# Patient Record
Sex: Female | Born: 1990 | Race: White | Hispanic: No | Marital: Married | Smoking: Never smoker
Health system: Southern US, Community
[De-identification: ages and names within clinical notes are randomized; demographics above are authoritative.]

## PROBLEM LIST (undated history)

## (undated) DIAGNOSIS — O139 Gestational [pregnancy-induced] hypertension without significant proteinuria, unspecified trimester: Secondary | ICD-10-CM

## (undated) DIAGNOSIS — K439 Ventral hernia without obstruction or gangrene: Secondary | ICD-10-CM

## (undated) DIAGNOSIS — F419 Anxiety disorder, unspecified: Secondary | ICD-10-CM

## (undated) DIAGNOSIS — I10 Essential (primary) hypertension: Secondary | ICD-10-CM

## (undated) DIAGNOSIS — G43109 Migraine with aura, not intractable, without status migrainosus: Secondary | ICD-10-CM

## (undated) HISTORY — DX: Anxiety disorder, unspecified: F41.9

## (undated) HISTORY — DX: Migraine with aura, not intractable, without status migrainosus: G43.109

## (undated) HISTORY — DX: Gestational (pregnancy-induced) hypertension without significant proteinuria, unspecified trimester: O13.9

## (undated) HISTORY — DX: Essential (primary) hypertension: I10

## (undated) HISTORY — PX: EYE SURGERY: SHX253

---
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
## 2011-07-18 LAB — POCT URINALYSIS DIP (DEVICE)
Glucose, UA: NEGATIVE
Hgb urine dipstick: NEGATIVE
Ketones, ur: 15 — AB
Nitrite: NEGATIVE
Operator id: 235561
Protein, ur: 100 — AB
Specific Gravity, Urine: 1.025
Urobilinogen, UA: 1
pH: 5.5

## 2012-03-03 ENCOUNTER — Emergency Department (HOSPITAL_COMMUNITY)
Admission: EM | Admit: 2012-03-03 | Discharge: 2012-03-03 | Disposition: A | Discharge status: Home / Self Care | Payer: BC Managed Care – PPO | Attending: Emergency Medicine | Admitting: Emergency Medicine

## 2012-03-03 ENCOUNTER — Encounter (HOSPITAL_COMMUNITY): Payer: Self-pay

## 2012-03-03 DIAGNOSIS — R51 Headache: Secondary | ICD-10-CM

## 2012-03-03 DIAGNOSIS — R110 Nausea: Secondary | ICD-10-CM | POA: Insufficient documentation

## 2012-03-03 LAB — POCT PREGNANCY, URINE: Preg Test, Ur: NEGATIVE

## 2012-03-03 MED ORDER — SODIUM CHLORIDE 0.9 % IV BOLUS (SEPSIS)
1000.0000 mL | Freq: Once | INTRAVENOUS | Status: AC
  Administered 2012-03-03: 1000 mL via INTRAVENOUS

## 2012-03-03 MED ORDER — SODIUM CHLORIDE 0.9 % IV SOLN
INTRAVENOUS | Status: DC
  Administered 2012-03-03: 20:00:00 via INTRAVENOUS

## 2012-03-03 MED ORDER — KETOROLAC TROMETHAMINE 30 MG/ML IJ SOLN
30.0000 mg | Freq: Once | INTRAMUSCULAR | Status: AC
  Administered 2012-03-03: 30 mg via INTRAVENOUS
  Filled 2012-03-03 (×2): qty 1

## 2012-03-03 MED ORDER — DIPHENHYDRAMINE HCL 50 MG/ML IJ SOLN
25.0000 mg | Freq: Once | INTRAMUSCULAR | Status: AC
  Administered 2012-03-03: 25 mg via INTRAVENOUS
  Filled 2012-03-03: qty 1

## 2012-03-03 MED ORDER — HYDROMORPHONE HCL PF 1 MG/ML IJ SOLN
1.0000 mg | Freq: Once | INTRAMUSCULAR | Status: AC
  Administered 2012-03-03: 1 mg via INTRAVENOUS
  Filled 2012-03-03: qty 1

## 2012-03-03 MED ORDER — DEXAMETHASONE SODIUM PHOSPHATE 10 MG/ML IJ SOLN
10.0000 mg | Freq: Once | INTRAMUSCULAR | Status: AC
  Administered 2012-03-03: 10 mg via INTRAVENOUS
  Filled 2012-03-03: qty 1

## 2012-03-03 MED ORDER — OXYCODONE-ACETAMINOPHEN 5-325 MG PO TABS: 2.0000 | ORAL_TABLET | ORAL | Status: AC | PRN

## 2012-03-03 MED ORDER — METOCLOPRAMIDE HCL 5 MG/ML IJ SOLN
10.0000 mg | Freq: Once | INTRAMUSCULAR | Status: AC
  Administered 2012-03-03: 10 mg via INTRAVENOUS
  Filled 2012-03-03: qty 2

## 2012-03-03 MED ORDER — METOCLOPRAMIDE HCL 10 MG PO TABS: 10.0000 mg | ORAL_TABLET | Freq: Four times a day (QID) | ORAL | Status: DC | PRN

## 2012-03-03 NOTE — ED Notes (Signed)
Patient states she has hx of migraine and states this headache started x 3 days ago and went away and started again last night around midnight and today pain is worse and is located in the back of her head and states this headache is worse than others she has had in the past. Patient state she has not had a menstrual cycle since January. Patient has used ice compresses and Bayer Aspirin for [employer].

## 2012-03-03 NOTE — Discharge Instructions (Signed)

## 2012-03-03 NOTE — ED Provider Notes (Signed)
History   This chart was scribed for Tammy Horn, MD scribed by Magnus Sinning. The patient was seen in room STRE7/STRE7 seen at 19:07    CSN: [id number]  Arrival date & time 03/03/12  7829   First MD Initiated Contact with Patient 03/03/12 1906      Chief Complaint  Patient presents with  . Migraine    (Consider location/radiation/quality/duration/timing/severity/associated sxs/prior treatment) HPI [name] is a 21 y.o. female who presents to the Emergency Department complaining of constant moderate HA, onset 4 days, with it being worse this morning at 1:00 AM, and associated nausea. Reports that she typically gets throbbing all over chronic HA multiple times a week, lasting between hours to days for the past 4 years. Says occasionally she also has associated nausea, vertigo, vomiting, generalized weakness, and numbness, but reports she has not experienced these sxs today. Normally treats HA with Imitrex, Fioricet, and motrin. Explains she did not take any of medications last night or today. Pt also states she was previously on flexeril to treat HA, which she explains helped resolved HA, but is no longer on Flexeril because medicaid ended. Denies changes in speech/vision/understanding, lateralizing coordination, vomiting, confusion, trauma, fever, or rashes.   [facility], but has  sees MD at school. Past Medical History  Diagnosis Date  . Migraine     History reviewed. No pertinent past surgical history.  No family history on file.  History  Substance Use Topics  . Smoking status: Never Smoker   . Smokeless tobacco: Not on file  . Alcohol Use: No   Review of Systems  Constitutional: Negative for fever.       10 Systems reviewed and are negative for acute change except as noted in the HPI.  HENT: Negative for congestion.   Eyes: Negative for discharge and redness.  Respiratory: Negative for cough and shortness of breath.   Cardiovascular: Negative  for chest pain.  Gastrointestinal: Positive for nausea. Negative for vomiting and abdominal pain.  Musculoskeletal: Negative for back pain.  Skin: Negative for rash.  Neurological: Positive for headaches. Negative for syncope and numbness.  Psychiatric/Behavioral:       No behavior change.  All other systems reviewed and are negative.    Allergies  Review of patient's allergies indicates no known allergies.  Home Medications   Current Outpatient Rx  Name Route Sig Dispense Refill  . BUTALBITAL-APAP-CAFFEINE 50-325-40 MG PO TABS Oral Take 1 tablet by mouth every 4 (four) hours as needed. For severe headache    . DICLOFENAC SODIUM 75 MG PO TBEC Oral Take 75 mg by mouth 2 (two) times daily.    . SUMATRIPTAN SUCCINATE 50 MG PO TABS Oral Take 50 mg by mouth daily as needed. For onset migraine    . METOCLOPRAMIDE HCL 10 MG PO TABS Oral Take 1 tablet (10 mg total) by mouth every 6 (six) hours as needed (nausea/headache). 6 tablet 0  . OXYCODONE-ACETAMINOPHEN 5-325 MG PO TABS Oral Take 2 tablets by mouth every 4 (four) hours as needed for pain. 6 tablet 0    BP 129/88  Pulse 74  Temp(Src) 98.2 F (36.8 C) (Oral)  Resp 20  SpO2 98%  LMP 11/04/2011  Physical Exam  Nursing note and vitals reviewed. Constitutional:       Awake, alert, nontoxic appearance with baseline speech for patient.  HENT:  Head: Atraumatic.  Mouth/Throat: Oropharynx is clear and moist. No oropharyngeal exudate.  Eyes: EOM are normal. Pupils  are equal, round, and reactive to light. Right eye exhibits no discharge. Left eye exhibits no discharge.  Neck: Neck supple.  Cardiovascular: Normal rate and regular rhythm.   No murmur heard. Pulmonary/Chest: Effort normal and breath sounds normal. No stridor. No respiratory distress. She has no wheezes. She has no rales. She exhibits no tenderness.  Abdominal: Soft. Bowel sounds are normal. She exhibits no mass. There is no tenderness. There is no rebound.    Musculoskeletal: She exhibits no tenderness.       Baseline ROM, moves extremities with no obvious new focal weakness.  Lymphadenopathy:    She has no cervical adenopathy.  Neurological:       Awake, alert, cooperative and aware of situation; motor strength bilaterally; sensation normal to light touch bilaterally; peripheral visual fields full to confrontation; no facial asymmetry; tongue midline; major cranial nerves appear intact; no pronator drift, normal finger to nose bilaterally, baseline gait without new ataxia.  Skin: No rash noted.  Psychiatric: She has a normal mood and affect.    ED Course  Procedures (including critical care time) DIAGNOSTIC STUDIES: Oxygen Saturation is 98% on room air, normal by my interpretation.    COORDINATION OF CARE: 19:49: EDMD performs recheck. Patient notes headache has improved.   Labs Reviewed  POCT PREGNANCY, URINE   1. Headache       MDM  I personally performed the services described in this documentation, which was scribed in my presence. The recorded information has been reviewed and considered. Pt stable in ED with no significant deterioration in condition.Patient / Family / Caregiver informed of clinical course, understand medical decision-making process, and agree with plan.I doubt any other EMC precluding discharge at this time including, but not necessarily limited to the following:SAH, CVA, SBI.        Tammy Horn, MD 03/03/12 [phone]

## 2012-03-03 NOTE — ED Notes (Signed)
Pt. Reports having a stiff neck, migraine, nausea and photophobia.  She also had the same symptoms on Wednesday.

## 2012-06-23 ENCOUNTER — Encounter (HOSPITAL_COMMUNITY): Payer: Self-pay | Admitting: Adult Health

## 2012-06-23 ENCOUNTER — Emergency Department (HOSPITAL_COMMUNITY)
Admission: EM | Admit: 2012-06-23 | Discharge: 2012-06-23 | Disposition: A | Discharge status: Home / Self Care | Payer: BC Managed Care – PPO | Attending: Emergency Medicine | Admitting: Emergency Medicine

## 2012-06-23 DIAGNOSIS — R51 Headache: Secondary | ICD-10-CM | POA: Insufficient documentation

## 2012-06-23 DIAGNOSIS — G43909 Migraine, unspecified, not intractable, without status migrainosus: Secondary | ICD-10-CM

## 2012-06-23 LAB — PREGNANCY, URINE: Preg Test, Ur: NEGATIVE

## 2012-06-23 MED ORDER — SODIUM CHLORIDE 0.9 % IV BOLUS (SEPSIS)
1000.0000 mL | Freq: Once | INTRAVENOUS | Status: AC
  Administered 2012-06-23: 1000 mL via INTRAVENOUS

## 2012-06-23 MED ORDER — METOCLOPRAMIDE HCL 5 MG/ML IJ SOLN
10.0000 mg | Freq: Once | INTRAMUSCULAR | Status: AC
  Administered 2012-06-23: 10 mg via INTRAVENOUS
  Filled 2012-06-23: qty 2

## 2012-06-23 MED ORDER — DIPHENHYDRAMINE HCL 50 MG/ML IJ SOLN
25.0000 mg | Freq: Once | INTRAMUSCULAR | Status: AC
  Administered 2012-06-23: 25 mg via INTRAVENOUS
  Filled 2012-06-23: qty 1

## 2012-06-23 MED ORDER — DEXAMETHASONE SODIUM PHOSPHATE 10 MG/ML IJ SOLN
10.0000 mg | Freq: Once | INTRAMUSCULAR | Status: AC
  Administered 2012-06-23: 10 mg via INTRAVENOUS
  Filled 2012-06-23: qty 1

## 2012-06-23 NOTE — ED Provider Notes (Signed)
History     CSN: [id number]  Arrival date & time 06/23/12  5284   First MD Initiated Contact with Patient 06/23/12 [phone]      Chief Complaint  Patient presents with  . Migraine    (Consider location/radiation/quality/duration/timing/severity/associated sxs/prior treatment) HPI Comments: Patient presents with a chief complaint of headache.  Headache has been constant since 2pm yesterday.  Headache is located in the left frontal region.  Does not radiate.  Headache associated with two episodes of vomiting yesterday.  She continues to have nausea, but no vomiting today. She states that shellfish typically is a trigger for her migraines.  Yesterday she had lobster ravioli.  She tried taking Bayer Migraine and Fioricet for her migraine, but did not feel that it helped.  She denies head trauma. She states that her headache is similar to Migraine headaches that she has had in the past.     Patient is a 21 y.o. female presenting with migraine. The history is provided by the patient.  Migraine This is a new problem. The current episode started yesterday. The problem occurs constantly. The problem has been gradually improving. Associated symptoms include headaches, nausea and vomiting. Pertinent negatives include no chills, fever, neck pain, numbness, rash, vertigo, visual change or weakness.    Past Medical History  Diagnosis Date  . Migraine     History reviewed. No pertinent past surgical history.  History reviewed. No pertinent family history.  History  Substance Use Topics  . Smoking status: Never Smoker   . Smokeless tobacco: Not on file  . Alcohol Use: No    OB History    Grav Para Term Preterm Abortions TAB SAB Ect Mult Living                  Review of Systems  Constitutional: Negative for fever and chills.  HENT: Negative for neck pain.   Eyes: Positive for photophobia. Negative for pain, redness and visual disturbance.  Gastrointestinal: Positive for nausea and  vomiting.  Skin: Negative for rash.  Neurological: Positive for headaches. Negative for dizziness, vertigo, syncope, weakness, light-headedness and numbness.  Psychiatric/Behavioral: Negative for confusion.    Allergies  Review of patient's allergies indicates no known allergies.  Home Medications   Current Outpatient Rx  Name Route Sig Dispense Refill  . ASPIRIN-ACETAMINOPHEN-CAFFEINE 250-250-65 MG PO TABS Oral Take 1 tablet by mouth every 6 (six) hours as needed. For headache    . BUTALBITAL-APAP-CAFFEINE 50-325-40 MG PO TABS Oral Take 1 tablet by mouth every 4 (four) hours as needed. For severe headache    . DICLOFENAC SODIUM 75 MG PO TBEC Oral Take 75 mg by mouth 2 (two) times daily.      BP 117/75  Pulse 71  Temp 98.7 F (37.1 C) (Oral)  Resp 18  SpO2 98%  Physical Exam  Nursing note and vitals reviewed. Constitutional: She appears well-developed and well-nourished.  HENT:  Head: Normocephalic and atraumatic.  Eyes: EOM are normal. Pupils are equal, round, and reactive to light.  Neck: Normal range of motion. Neck supple.  Cardiovascular: Normal rate, regular rhythm and normal heart sounds.   Pulmonary/Chest: Effort normal and breath sounds normal.  Neurological: She is alert. She has normal strength. No cranial nerve deficit or sensory deficit. Coordination and gait normal.       Normal visual fields Normal finger to nose testing. Normal rapid alternating movements Normal gait, no ataxia.  Skin: Skin is warm and dry.  Psychiatric: She has a normal  mood and affect.    ED Course  Procedures (including critical care time)   Labs Reviewed  PREGNANCY, URINE   No results found.   No diagnosis found.  7:26 AM Reassessed patient.  She states that her headache has resolved at this time.  MDM  Pt HA treated and improved while in ED.  Presentation is like pts typical HA and non concerning for [facility], ICH, or Meningitis. Pt is afebrile with no focal neuro deficits,  nuchal rigidity, or change in vision. No red flags.  Pt is to follow up with PCP to discuss prophylactic medication. Pt verbalizes understanding and is agreeable with plan to dc.         Pascal Lux Brandon, PA-C 06/23/12 [phone]

## 2012-06-23 NOTE — ED Notes (Addendum)
Migraines since 2pm yesterday c/o nausea and neck pain and ear pain. Sensitive to light and sound. Typical migraine for her, rescue medicaitons are not relieving pain. Neurologically intact

## 2012-06-23 NOTE — ED Notes (Signed)
Pt having MIgraine HA the past 5 years.   Last migraine in May 2013.  Tonight HA began at 1400  8.30.13  Not relieved by Bayer Migraine and  Butalb.  C/O left frontal HA behind her left eye, sharp pain into left ear and posterior neck pain.  HA associated with Photophobia, nausea, vomiting times two.Marland Kitchen  Has migraine cooling patch to forehead. Pain rated 8/10

## 2012-06-23 NOTE — ED Provider Notes (Signed)
Medical screening examination/treatment/procedure(s) were performed by non-physician practitioner and as supervising physician I was immediately available for consultation/collaboration.    Esther Broyles D Kelvin Sennett, MD 06/23/12 1704 

## 2012-10-01 ENCOUNTER — Other Ambulatory Visit: Payer: Self-pay | Admitting: Physician Assistant

## 2012-10-02 NOTE — Telephone Encounter (Signed)
No paper chart °

## 2012-10-02 NOTE — Telephone Encounter (Signed)
Please pull chart.

## 2012-10-04 ENCOUNTER — Other Ambulatory Visit: Payer: Self-pay | Admitting: Physician Assistant

## 2012-10-05 NOTE — Telephone Encounter (Signed)
Please pull paper chart.  

## 2012-10-05 NOTE — Telephone Encounter (Signed)
Pt does not exist in [facility] by name and dob. No paper chart.  bf

## 2012-10-09 ENCOUNTER — Other Ambulatory Visit: Payer: Self-pay | Admitting: Physician Assistant

## 2012-10-09 NOTE — Telephone Encounter (Signed)
Please pull paper chart.  

## 2012-10-10 NOTE — Telephone Encounter (Signed)
No paper chart °

## 2012-10-11 ENCOUNTER — Other Ambulatory Visit: Payer: Self-pay | Admitting: Physician Assistant

## 2013-10-02 ENCOUNTER — Emergency Department (HOSPITAL_COMMUNITY)
Admission: EM | Admit: 2013-10-02 | Discharge: 2013-10-02 | Disposition: A | Discharge status: Home / Self Care | Payer: Self-pay | Attending: Emergency Medicine | Admitting: Emergency Medicine

## 2013-10-02 ENCOUNTER — Emergency Department (HOSPITAL_COMMUNITY): Payer: Self-pay

## 2013-10-02 ENCOUNTER — Encounter (HOSPITAL_COMMUNITY): Payer: Self-pay | Admitting: Emergency Medicine

## 2013-10-02 DIAGNOSIS — M549 Dorsalgia, unspecified: Secondary | ICD-10-CM | POA: Insufficient documentation

## 2013-10-02 DIAGNOSIS — Z79899 Other long term (current) drug therapy: Secondary | ICD-10-CM | POA: Insufficient documentation

## 2013-10-02 DIAGNOSIS — R112 Nausea with vomiting, unspecified: Secondary | ICD-10-CM | POA: Insufficient documentation

## 2013-10-02 DIAGNOSIS — Z3202 Encounter for pregnancy test, result negative: Secondary | ICD-10-CM | POA: Insufficient documentation

## 2013-10-02 DIAGNOSIS — R109 Unspecified abdominal pain: Secondary | ICD-10-CM | POA: Insufficient documentation

## 2013-10-02 DIAGNOSIS — Z8669 Personal history of other diseases of the nervous system and sense organs: Secondary | ICD-10-CM | POA: Insufficient documentation

## 2013-10-02 DIAGNOSIS — R509 Fever, unspecified: Secondary | ICD-10-CM | POA: Insufficient documentation

## 2013-10-02 LAB — LIPASE, BLOOD: Lipase: 26 U/L (ref 11–59)

## 2013-10-02 LAB — URINALYSIS, ROUTINE W REFLEX MICROSCOPIC
Glucose, UA: NEGATIVE mg/dL
Hgb urine dipstick: NEGATIVE
Ketones, ur: 40 mg/dL — AB
Nitrite: NEGATIVE
Protein, ur: 30 mg/dL — AB
Specific Gravity, Urine: 1.036 — ABNORMAL HIGH (ref 1.005–1.030)
Urobilinogen, UA: 1 mg/dL (ref 0.0–1.0)
pH: 7 (ref 5.0–8.0)

## 2013-10-02 LAB — COMPREHENSIVE METABOLIC PANEL
ALT: 15 U/L (ref 0–35)
AST: 16 U/L (ref 0–37)
Albumin: 4.1 g/dL (ref 3.5–5.2)
Alkaline Phosphatase: 69 U/L (ref 39–117)
BUN: 12 mg/dL (ref 6–23)
CO2: 24 mEq/L (ref 19–32)
Calcium: 9.4 mg/dL (ref 8.4–10.5)
Chloride: 100 mEq/L (ref 96–112)
Creatinine, Ser: 0.66 mg/dL (ref 0.50–1.10)
GFR calc Af Amer: >90 mL/min (ref >90)
GFR calc non Af Amer: >90 mL/min (ref >90)
Glucose, Bld: 87 mg/dL (ref 70–99)
Potassium: 3.2 mEq/L — ABNORMAL LOW (ref 3.5–5.1)
Sodium: 139 mEq/L (ref 135–145)
Total Bilirubin: 0.3 mg/dL (ref 0.3–1.2)
Total Protein: 8.2 g/dL (ref 6.0–8.3)

## 2013-10-02 LAB — URINE MICROSCOPIC-ADD ON

## 2013-10-02 LAB — CBC WITH DIFFERENTIAL/PLATELET
Basophils Absolute: 0 10*3/uL (ref 0.0–0.1)
Basophils Relative: 0 % (ref 0–1)
Eosinophils Absolute: 0 10*3/uL (ref 0.0–0.7)
Eosinophils Relative: 0 % (ref 0–5)
HCT: 42.8 % (ref 36.0–46.0)
Hemoglobin: 14.4 g/dL (ref 12.0–15.0)
Lymphocytes Relative: 26 % (ref 12–46)
Lymphs Abs: 2.4 10*3/uL (ref 0.7–4.0)
MCH: 29.3 pg (ref 26.0–34.0)
MCHC: 33.6 g/dL (ref 30.0–36.0)
MCV: 87.2 fL (ref 78.0–100.0)
Monocytes Absolute: 0.7 10*3/uL (ref 0.1–1.0)
Monocytes Relative: 7 % (ref 3–12)
Neutro Abs: 6.2 10*3/uL (ref 1.7–7.7)
Neutrophils Relative %: 67 % (ref 43–77)
Platelets: 357 10*3/uL (ref 150–400)
RBC: 4.91 MIL/uL (ref 3.87–5.11)
RDW: 12.9 % (ref 11.5–15.5)
WBC: 9.3 10*3/uL (ref 4.0–10.5)

## 2013-10-02 LAB — POCT PREGNANCY, URINE: Preg Test, Ur: NEGATIVE

## 2013-10-02 MED ORDER — ONDANSETRON HCL 4 MG/2ML IJ SOLN
4.0000 mg | Freq: Once | INTRAMUSCULAR | Status: AC
  Administered 2013-10-02: 4 mg via INTRAVENOUS
  Filled 2013-10-02: qty 2

## 2013-10-02 MED ORDER — HYDROCODONE-ACETAMINOPHEN 5-325 MG PO TABS: 1.0000 | ORAL_TABLET | ORAL | Status: DC | PRN

## 2013-10-02 MED ORDER — SODIUM CHLORIDE 0.9 % IV BOLUS (SEPSIS)
1000.0000 mL | Freq: Once | INTRAVENOUS | Status: AC
  Administered 2013-10-02: 1000 mL via INTRAVENOUS

## 2013-10-02 MED ORDER — ACETAMINOPHEN 325 MG PO TABS
650.0000 mg | ORAL_TABLET | Freq: Once | ORAL | Status: DC
  Filled 2013-10-02: qty 2

## 2013-10-02 MED ORDER — ACETAMINOPHEN 500 MG PO TABS
1000.0000 mg | ORAL_TABLET | Freq: Once | ORAL | Status: AC
  Administered 2013-10-02: 1000 mg via ORAL
  Filled 2013-10-02: qty 2

## 2013-10-02 MED ORDER — ONDANSETRON HCL 4 MG PO TABS: 4.0000 mg | ORAL_TABLET | Freq: Four times a day (QID) | ORAL | Status: DC | PRN

## 2013-10-02 NOTE — ED Provider Notes (Signed)
CSN: [id number]     Arrival date & time 10/02/13  1640 History   First MD Initiated Contact with Patient 10/02/13 1654     Chief Complaint  Patient presents with  . Abdominal Pain  . Emesis   (Consider location/radiation/quality/duration/timing/severity/associated sxs/prior Treatment) HPI  Chief complaint abdominal pain  Patient is a 22 year old female no significant past medical history comes today with chief complaint abdominal pain. Patient states this began yesterday. States she had a right-sided flank pain which is now migrated to flank pain and right sided pain more in upper quadrants. States this is a 9/10. It is waxing and waning. Described as a sharp stabbing pain. Patient also endorses some nausea and vomiting. Denies any vaginal discharge, dysuria.   Past Medical History  Diagnosis Date  . Migraine    History reviewed. No pertinent past surgical history. No family history on file. History  Substance Use Topics  . Smoking status: Never Smoker   . Smokeless tobacco: Not on file  . Alcohol Use: No   OB History   Grav Para Term Preterm Abortions TAB SAB Ect Mult Living                 Review of Systems  Constitutional: Positive for fever (subjective). Negative for fatigue.  Respiratory: Negative for shortness of breath.   Cardiovascular: Negative for chest pain.  Gastrointestinal: Positive for nausea, vomiting and abdominal pain.  Genitourinary: Negative for dysuria and dyspareunia.  Musculoskeletal: Positive for back pain (flank pain).  Skin: Negative for rash.  Neurological: Negative for headaches.  Psychiatric/Behavioral: Negative for agitation.  All other systems reviewed and are negative.    Allergies  Bee venom  Home Medications   Current Outpatient Rx  Name  Route  Sig  Dispense  Refill  . cyclobenzaprine (FLEXERIL) 10 MG tablet   Oral   Take 10 mg by mouth 3 (three) times daily as needed for muscle spasms (migraine).          . naproxen  (NAPROSYN) 500 MG tablet   Oral   Take 500 mg by mouth 2 (two) times daily with a meal.         . norgestimate-ethinyl estradiol (ORTHO-CYCLEN,SPRINTEC,PREVIFEM) 0.25-35 MG-MCG tablet   Oral   Take 1 tablet by mouth daily.         . rizatriptan (MAXALT) 10 MG tablet   Oral   Take 10 mg by mouth as needed for migraine. May repeat in 2 hours if needed         . HYDROcodone-acetaminophen (NORCO/VICODIN) 5-325 MG per tablet   Oral   Take 1-2 tablets by mouth every 4 (four) hours as needed.   10 tablet   0   . ondansetron (ZOFRAN) 4 MG tablet   Oral   Take 1 tablet (4 mg total) by mouth every 6 (six) hours as needed for nausea or vomiting.   12 tablet   0    BP 114/70  Pulse 65  Temp(Src) 98.5 F (36.9 C) (Oral)  Resp 20  Wt 266 lb 14.4 oz (121.065 kg)  SpO2 98% Physical Exam  Nursing note and vitals reviewed. Constitutional: She is oriented to person, place, and time. She appears well-developed and well-nourished.  HENT:  Head: Normocephalic and atraumatic.  Eyes: EOM are normal. Pupils are equal, round, and reactive to light.  Neck: Normal range of motion.  Cardiovascular: Normal rate, regular rhythm and intact distal pulses.   Pulmonary/Chest: Effort normal and breath sounds normal. No  respiratory distress.  Abdominal: Soft. She exhibits no distension. There is tenderness. There is no rebound and no guarding.  Patient with tenderness palpation of right flank and right side abdomen. No peritonitis. No rebound no guarding  Musculoskeletal: Normal range of motion. She exhibits no edema.  Neurological: She is alert and oriented to person, place, and time. No cranial nerve deficit. She exhibits normal muscle tone. Coordination normal.  Skin: Skin is warm and dry.  Psychiatric: She has a normal mood and affect. Her behavior is normal. Judgment and thought content normal.    ED Course  Procedures (including critical care time) Labs Review Labs Reviewed  URINALYSIS,  ROUTINE W REFLEX MICROSCOPIC - Abnormal; Notable for the following:    Color, Urine AMBER (*)    APPearance HAZY (*)    Specific Gravity, Urine 1.036 (*)    Bilirubin Urine SMALL (*)    Ketones, ur 40 (*)    Protein, ur 30 (*)    Leukocytes, UA MODERATE (*)    All other components within normal limits  COMPREHENSIVE METABOLIC PANEL - Abnormal; Notable for the following:    Potassium 3.2 (*)    All other components within normal limits  URINE MICROSCOPIC-ADD ON - Abnormal; Notable for the following:    Squamous Epithelial / LPF MANY (*)    Bacteria, UA FEW (*)    All other components within normal limits  URINE CULTURE  CBC WITH DIFFERENTIAL  LIPASE, BLOOD  POCT PREGNANCY, URINE   Imaging Review US Abdomen Complete  10/02/2013   CLINICAL DATA:  Abdominal pain with nausea and vomiting  EXAM: ULTRASOUND ABDOMEN COMPLETE  COMPARISON:  None.  FINDINGS: Gallbladder:  No gallstones or wall thickening visualized. There is no pericholecystic fluid. No sonographic Murphy sign noted.  Common bile duct:  Diameter: 5 mm proximally. The distal common bile duct is obscured by gas. There is no intrahepatic biliary duct dilatation.  Liver:  The liver echogenicity is increased and mildly inhomogeneous. No focal liver lesions are identified.  IVC:  No abnormality visualized.  Pancreas:  Visualized portion unremarkable. Most of pancreas is obscured by gas.  Spleen:  Size and appearance within normal limits.  Right Kidney:  Length: 11.5 cm. Echogenicity within normal limits. No mass or hydronephrosis visualized.  Left Kidney:  Length: 11.1 cm. Echogenicity within normal limits. No mass or hydronephrosis visualized.  Abdominal aorta:  No aneurysm visualized.  Other findings:  No demonstrable ascites.  IMPRESSION: Pancreas is largely obscured by gas. Liver has increased echogenicity and mild homogeneity to the echotexture. This appearance may be due to fatty change. There may also be some underlying parenchymal  liver disease. While no focal liver lesions are identified, it must be cautioned that the sensitivity of ultrasound for focal liver lesions is diminished in this circumstance.  Study otherwise unremarkable.   Electronically Signed   By: Bretta Bang M.D.   On: 10/02/2013 20:16    EKG Interpretation   None       MDM   1. Abdominal pain     Afebrile vital signs stable on arrival. Patient with right flank pain and some right-sided abdominal pain. UA shows no sign of infection. No blood in urine. No leukocytosis. Minimal CVA tenderness. Doubt pyelonephritis, nephrolithiasis or cystitis. CBC, CMP unremarkable. Reexam patient continues to have right flank pain as well as some right-sided abdominal pain, mainly in the upper quadrant.. Secondary this patient was sent for abdominal ultrasound. Ultrasound showed no biliary or other concerning issues.  On reexamination patient has no right lower quadrant tenderness palpation concerning for appendicitis. Patient denies any vaginal discharge vaginal pain. No lower quadrant tenderness. Patient has deferred pelvic exam at this time, believe this appropriate. Patient was given Zofran and pain medication. Some improvement in symptoms. Given no organic cause of abdominal pain identified and normal labs appropriate for d/c. Patient was given strict return precautions for worsening abdominal pain, migration of pain right lower quadrant, fevers, chills or other concerning signs or symptoms. Voiced understanding and no further acute issues until discharge.    Bridgett Larsson, MD 10/02/13 [phone]

## 2013-10-02 NOTE — ED Notes (Signed)
Pt presents to department for evaluation of R sided abdominal pain and N/V. Ongoing since yesterday. 9/10 pain. Also states constipation and generalized fatigue. Denies urinary symptoms. Pt is alert and oriented x4. NAD.

## 2013-10-03 NOTE — ED Provider Notes (Signed)
I saw and evaluated the patient, reviewed the resident's note and I agree with the findings and plan.  EKG Interpretation   None       No RLQ tenderness to suggest ovarian pathology or appendicitis. Benign exam, but is tender RUQ and epigastric so U/S pursued. I have low suspicion for surgical kidney stone or other acute surgical pathology at this time. Will treat symptomatically and discussed strict return precautions.   Audree Camel, MD 10/03/13 [phone]

## 2013-10-04 LAB — URINE CULTURE: Colony Count: 40000

## 2013-11-13 ENCOUNTER — Emergency Department (HOSPITAL_COMMUNITY)
Admission: EM | Admit: 2013-11-13 | Discharge: 2013-11-13 | Discharge status: Against Medical Advice | Payer: BC Managed Care – PPO | Attending: Emergency Medicine | Admitting: Emergency Medicine

## 2013-11-13 ENCOUNTER — Encounter (HOSPITAL_COMMUNITY): Payer: Self-pay | Admitting: Emergency Medicine

## 2013-11-13 DIAGNOSIS — R1110 Vomiting, unspecified: Secondary | ICD-10-CM | POA: Insufficient documentation

## 2013-11-13 DIAGNOSIS — R51 Headache: Secondary | ICD-10-CM | POA: Insufficient documentation

## 2013-11-13 NOTE — ED Notes (Signed)
The pt hashad a headache for 2 days with vomiting.  She took a vicodin that made her sleep but when she woke up her headache was still there

## 2013-11-13 NOTE — ED Notes (Signed)
Called to take back to a room and no answer

## 2013-12-03 ENCOUNTER — Other Ambulatory Visit: Payer: Self-pay | Admitting: Physician Assistant

## 2013-12-26 ENCOUNTER — Other Ambulatory Visit: Payer: Self-pay | Admitting: Physician Assistant

## 2014-03-10 ENCOUNTER — Emergency Department (HOSPITAL_COMMUNITY)
Admission: EM | Admit: 2014-03-10 | Discharge: 2014-03-10 | Discharge status: Against Medical Advice | Payer: BC Managed Care – PPO | Attending: Emergency Medicine | Admitting: Emergency Medicine

## 2014-03-10 ENCOUNTER — Encounter (HOSPITAL_COMMUNITY): Payer: Self-pay | Admitting: Emergency Medicine

## 2014-03-10 DIAGNOSIS — Z3202 Encounter for pregnancy test, result negative: Secondary | ICD-10-CM | POA: Insufficient documentation

## 2014-03-10 DIAGNOSIS — G43909 Migraine, unspecified, not intractable, without status migrainosus: Secondary | ICD-10-CM | POA: Insufficient documentation

## 2014-03-10 LAB — POC URINE PREG, ED: Preg Test, Ur: NEGATIVE

## 2014-03-10 NOTE — ED Notes (Signed)
Pt c/o migraine headache x's 4 days.   St's she is out of her migraine meds and does not have insurance anymore to get it filled.  Pt also c/o nausea with vomiting

## 2014-05-02 ENCOUNTER — Encounter (HOSPITAL_COMMUNITY): Payer: Self-pay | Admitting: Emergency Medicine

## 2014-05-02 ENCOUNTER — Emergency Department (HOSPITAL_COMMUNITY)
Admission: EM | Admit: 2014-05-02 | Discharge: 2014-05-02 | Disposition: A | Discharge status: Home / Self Care | Payer: BC Managed Care – PPO | Attending: Emergency Medicine | Admitting: Emergency Medicine

## 2014-05-02 DIAGNOSIS — Z79899 Other long term (current) drug therapy: Secondary | ICD-10-CM | POA: Insufficient documentation

## 2014-05-02 DIAGNOSIS — Z3202 Encounter for pregnancy test, result negative: Secondary | ICD-10-CM | POA: Insufficient documentation

## 2014-05-02 DIAGNOSIS — G43909 Migraine, unspecified, not intractable, without status migrainosus: Secondary | ICD-10-CM

## 2014-05-02 DIAGNOSIS — K920 Hematemesis: Secondary | ICD-10-CM | POA: Insufficient documentation

## 2014-05-02 DIAGNOSIS — G43009 Migraine without aura, not intractable, without status migrainosus: Secondary | ICD-10-CM | POA: Insufficient documentation

## 2014-05-02 LAB — CBC WITH DIFFERENTIAL/PLATELET
Basophils Absolute: 0 10*3/uL (ref 0.0–0.1)
Basophils Relative: 0 % (ref 0–1)
Eosinophils Absolute: 0 10*3/uL (ref 0.0–0.7)
Eosinophils Relative: 0 % (ref 0–5)
HCT: 41.4 % (ref 36.0–46.0)
Hemoglobin: 13.6 g/dL (ref 12.0–15.0)
Lymphocytes Relative: 25 % (ref 12–46)
Lymphs Abs: 2.1 10*3/uL (ref 0.7–4.0)
MCH: 28.6 pg (ref 26.0–34.0)
MCHC: 32.9 g/dL (ref 30.0–36.0)
MCV: 87.2 fL (ref 78.0–100.0)
Monocytes Absolute: 0.6 10*3/uL (ref 0.1–1.0)
Monocytes Relative: 7 % (ref 3–12)
Neutro Abs: 5.8 10*3/uL (ref 1.7–7.7)
Neutrophils Relative %: 68 % (ref 43–77)
Platelets: 349 10*3/uL (ref 150–400)
RBC: 4.75 MIL/uL (ref 3.87–5.11)
RDW: 13.1 % (ref 11.5–15.5)
WBC: 8.5 10*3/uL (ref 4.0–10.5)

## 2014-05-02 LAB — COMPREHENSIVE METABOLIC PANEL
ALT: 18 U/L (ref 0–35)
AST: 23 U/L (ref 0–37)
Albumin: 4.2 g/dL (ref 3.5–5.2)
Alkaline Phosphatase: 81 U/L (ref 39–117)
Anion gap: 19 — ABNORMAL HIGH (ref 5–15)
BUN: 8 mg/dL (ref 6–23)
CO2: 22 mEq/L (ref 19–32)
Calcium: 9.5 mg/dL (ref 8.4–10.5)
Chloride: 99 mEq/L (ref 96–112)
Creatinine, Ser: 0.62 mg/dL (ref 0.50–1.10)
GFR calc Af Amer: >90 mL/min (ref >90)
GFR calc non Af Amer: >90 mL/min (ref >90)
Glucose, Bld: 86 mg/dL (ref 70–99)
Potassium: 3.7 mEq/L (ref 3.7–5.3)
Sodium: 140 mEq/L (ref 137–147)
Total Bilirubin: 0.3 mg/dL (ref 0.3–1.2)
Total Protein: 8.1 g/dL (ref 6.0–8.3)

## 2014-05-02 LAB — URINALYSIS, ROUTINE W REFLEX MICROSCOPIC
Glucose, UA: NEGATIVE mg/dL
Hgb urine dipstick: NEGATIVE
Ketones, ur: >80 mg/dL — AB
Nitrite: NEGATIVE
Protein, ur: NEGATIVE mg/dL
Specific Gravity, Urine: 1.031 — ABNORMAL HIGH (ref 1.005–1.030)
Urobilinogen, UA: 0.2 mg/dL (ref 0.0–1.0)
pH: 5.5 (ref 5.0–8.0)

## 2014-05-02 LAB — URINE MICROSCOPIC-ADD ON

## 2014-05-02 LAB — POC URINE PREG, ED: Preg Test, Ur: NEGATIVE

## 2014-05-02 LAB — LIPASE, BLOOD: Lipase: 21 U/L (ref 11–59)

## 2014-05-02 MED ORDER — SODIUM CHLORIDE 0.9 % IV BOLUS (SEPSIS)
1000.0000 mL | Freq: Once | INTRAVENOUS | Status: AC
  Administered 2014-05-02: 1000 mL via INTRAVENOUS

## 2014-05-02 MED ORDER — METOCLOPRAMIDE HCL 10 MG PO TABS: 10.0000 mg | ORAL_TABLET | Freq: Four times a day (QID) | ORAL | Status: DC

## 2014-05-02 MED ORDER — METOCLOPRAMIDE HCL 5 MG/ML IJ SOLN
10.0000 mg | Freq: Once | INTRAMUSCULAR | Status: AC
  Administered 2014-05-02: 10 mg via INTRAVENOUS
  Filled 2014-05-02: qty 2

## 2014-05-02 MED ORDER — DEXAMETHASONE SODIUM PHOSPHATE 10 MG/ML IJ SOLN
10.0000 mg | Freq: Once | INTRAMUSCULAR | Status: AC
  Administered 2014-05-02: 10 mg via INTRAVENOUS
  Filled 2014-05-02: qty 1

## 2014-05-02 MED ORDER — DIPHENHYDRAMINE HCL 50 MG/ML IJ SOLN
25.0000 mg | Freq: Once | INTRAMUSCULAR | Status: AC
  Administered 2014-05-02: 25 mg via INTRAVENOUS
  Filled 2014-05-02: qty 1

## 2014-05-02 MED ORDER — BUTALBITAL-APAP-CAFFEINE 50-325-40 MG PO TABS: 1.0000 | ORAL_TABLET | Freq: Four times a day (QID) | ORAL | Status: AC | PRN

## 2014-05-02 MED ORDER — ONDANSETRON 4 MG PO TBDP
8.0000 mg | ORAL_TABLET | Freq: Once | ORAL | Status: AC
Start: 2014-05-02 — End: 2014-05-02
  Administered 2014-05-02: 8 mg via ORAL
  Filled 2014-05-02: qty 2

## 2014-05-02 NOTE — ED Notes (Signed)
Pt states she has had migraine for 3 days and now is vomiting blood.  Pt states abdomen feels like someone is punching it

## 2014-05-02 NOTE — Discharge Instructions (Signed)
Do not hesitate to return to the Emergency Department for any new, worsening or concerning symptoms.   If you do not have a primary care doctor you can establish one at the   [facility]: [address] [place] [id number] [phone]  After you establish care. Let them know you were seen in the emergency room. They must obtain records for further management.    Migraine Headache A migraine headache is an intense, throbbing pain on one or both sides of your head. A migraine can last for 30 minutes to several hours. CAUSES  The exact cause of a migraine headache is not always known. However, a migraine may be caused when nerves in the brain become irritated and release chemicals that cause inflammation. This causes pain. Certain things may also trigger migraines, such as:  Alcohol.  Smoking.  Stress.  Menstruation.  Aged cheeses.  Foods or drinks that contain nitrates, glutamate, aspartame, or tyramine.  Lack of sleep.  Chocolate.  Caffeine.  Hunger.  Physical exertion.  Fatigue.  Medicines used to treat chest pain (nitroglycerine), birth control pills, estrogen, and some blood pressure medicines. SIGNS AND SYMPTOMS  Pain on one or both sides of your head.  Pulsating or throbbing pain.  Severe pain that prevents daily activities.  Pain that is aggravated by any physical activity.  Nausea, vomiting, or both.  Dizziness.  Pain with exposure to bright lights, loud noises, or activity.  General sensitivity to bright lights, loud noises, or smells. Before you get a migraine, you may get warning signs that a migraine is coming (aura). An aura may include:  Seeing flashing lights.  Seeing bright spots, halos, or zig-zag lines.  Having tunnel vision or blurred vision.  Having feelings of numbness or tingling.  Having trouble talking.  Having muscle weakness. DIAGNOSIS  A migraine headache is often diagnosed based  on:  Symptoms.  Physical exam.  A CT scan or MRI of your head. These imaging tests cannot diagnose migraines, but they can help rule out other causes of headaches. TREATMENT Medicines may be given for pain and nausea. Medicines can also be given to help prevent recurrent migraines.  HOME CARE INSTRUCTIONS  Only take over-the-counter or prescription medicines for pain or discomfort as directed by your health care provider. The use of long-term narcotics is not recommended.  Lie down in a dark, quiet room when you have a migraine.  Keep a journal to find out what may trigger your migraine headaches. For example, write down:  What you eat and drink.  How much sleep you get.  Any change to your diet or medicines.  Limit alcohol consumption.  Quit smoking if you smoke.  Get 7-9 hours of sleep, or as recommended by your health care provider.  Limit stress.  Keep lights dim if bright lights bother you and make your migraines worse. SEEK IMMEDIATE MEDICAL CARE IF:   Your migraine becomes severe.  You have a fever.  You have a stiff neck.  You have vision loss.  You have muscular weakness or loss of muscle control.  You start losing your balance or have trouble walking.  You feel faint or pass out.  You have severe symptoms that are different from your first symptoms. MAKE SURE YOU:   Understand these instructions.  Will watch your condition.  Will get help right away if you are not doing well or get worse. Document Released: 10/10/2005 Document Revised: 07/31/2013 Document Reviewed: 06/17/2013 [employer] Patient Information 2015  [employer]. This information is not intended to replace advice given to you by your health care provider. Make sure you discuss any questions you have with your health care provider.

## 2014-05-02 NOTE — ED Provider Notes (Signed)
Medical screening examination/treatment/procedure(s) were performed by non-physician practitioner and as supervising physician I was immediately available for consultation/collaboration.   EKG Interpretation None        Glynn OctaveStephen Miles Leyda, MD 05/02/14 2359

## 2014-05-02 NOTE — ED Provider Notes (Signed)
CSN: [id number]     Arrival date & time 05/02/14  1316 History   First MD Initiated Contact with Patient 05/02/14 1650     Chief Complaint  Patient presents with  . Hematemesis  . Migraine    3 days     (Consider location/radiation/quality/duration/timing/severity/associated sxs/prior Treatment) HPI  Tammy Howard is a 23 y.o. female with no significant past medical history complaining of exacerbation of chronic migraine starting 3 days ago not relieved with over-the-counter medications in addition to Norco and Benadryl. Patient has associated photophobia, nausea vomiting which she describes as blood tinged starting this morning. She has diffuse abdominal pain, diffuse neck and upper back pain. Pt denies fever, rash, confusion,  LOC/syncope, change in vision, N/V, numbness, weakness, dysarthria, ataxia, thunderclap onset, exacerbation with exertion or valsalva, exacerbation in morning, CP, SOB.   Past Medical History  Diagnosis Date  . Migraine    Past Surgical History  Procedure Laterality Date  . Eye surgery     No family history on file. History  Substance Use Topics  . Smoking status: Never Smoker   . Smokeless tobacco: Not on file  . Alcohol Use: Yes     Comment: rarely   OB History   Grav Para Term Preterm Abortions TAB SAB Ect Mult Living                 Review of Systems  10 systems reviewed and found to be negative, except as noted in the HPI.   Allergies  Bee venom  Home Medications   Prior to Admission medications   Medication Sig Start Date End Date Taking? Authorizing Provider  diphenhydrAMINE (BENADRYL) 25 mg capsule Take 25 mg by mouth every 6 (six) hours as needed.   Yes Historical Provider, MD  HYDROcodone-acetaminophen (NORCO) 7.5-325 MG per tablet Take 1 tablet by mouth every 6 (six) hours as needed for moderate pain.   Yes Historical Provider, MD  norgestimate-ethinyl estradiol (ORTHO-CYCLEN,SPRINTEC,PREVIFEM) 0.25-35 MG-MCG tablet Take 1 tablet  by mouth daily.   Yes Historical Provider, MD  butalbital-acetaminophen-caffeine (FIORICET) 50-325-40 MG per tablet Take 1 tablet by mouth every 6 (six) hours as needed for headache. 05/02/14 05/02/15  Joni Reining Phenix Vandermeulen, PA-C  metoCLOPramide (REGLAN) 10 MG tablet Take 1 tablet (10 mg total) by mouth every 6 (six) hours. 05/02/14   Ignazio Kincaid, PA-C   BP 90/54  Pulse 65  Temp(Src) 98.3 F (36.8 C) (Oral)  Resp 20  SpO2 97%  LMP 04/21/2014 Physical Exam  Nursing note and vitals reviewed. Constitutional: She is oriented to person, place, and time. She appears well-developed and well-nourished. No distress.  Obese  HENT:  Head: Normocephalic and atraumatic.  Mouth/Throat: Oropharynx is clear and moist.  Eyes: Conjunctivae and EOM are normal. Pupils are equal, round, and reactive to light.  Neck: Normal range of motion. Neck supple.  FROM to C-spine. Pt can touch chin to chest without discomfort. No TTP of midline cervical spine.   Cardiovascular: Normal rate, regular rhythm and intact distal pulses.   Pulmonary/Chest: Effort normal and breath sounds normal. No stridor. No respiratory distress. She has no wheezes. She has no rales. She exhibits no tenderness.  Abdominal: Soft. Bowel sounds are normal. She exhibits no distension and no mass. There is no tenderness. There is no rebound and no guarding.  Musculoskeletal: Normal range of motion. She exhibits no edema.  Neurological: She is alert and oriented to person, place, and time.  II-Visual fields grossly intact. III/IV/VI-Extraocular movements intact.  Pupils reactive bilaterally. V/VII-Smile symmetric, equal eyebrow raise,  facial sensation intact VIII- Hearing grossly intact IX/X-Normal gag XI-bilateral shoulder shrug XII-midline tongue extension Motor: 5/5 bilaterally with normal tone and bulk Cerebellar: Normal finger-to-nose  and normal heel-to-shin test.   Romberg negative Ambulates with a coordinated gait   Psychiatric:  She has a normal mood and affect.    ED Course  Procedures (including critical care time) Labs Review Labs Reviewed  COMPREHENSIVE METABOLIC PANEL - Abnormal; Notable for the following:    Anion gap 19 (*)    All other components within normal limits  URINALYSIS, ROUTINE W REFLEX MICROSCOPIC - Abnormal; Notable for the following:    Color, Urine AMBER (*)    APPearance CLOUDY (*)    Specific Gravity, Urine 1.031 (*)    Bilirubin Urine SMALL (*)    Ketones, ur >80 (*)    Leukocytes, UA SMALL (*)    All other components within normal limits  URINE MICROSCOPIC-ADD ON - Abnormal; Notable for the following:    Squamous Epithelial / LPF FEW (*)    Bacteria, UA MANY (*)    All other components within normal limits  CBC WITH DIFFERENTIAL  LIPASE, BLOOD  POC URINE PREG, ED    Imaging Review No results found.   EKG Interpretation None      Headache improved to 3/10 after fluids and headache cocktail. Advised patient not to take narcotics for headache control.  MDM   Final diagnoses:  Migraine without status migrainosus, not intractable, unspecified migraine type    Filed Vitals:   05/02/14 1730 05/02/14 1815 05/02/14 1908 05/02/14 1908  BP: 137/104   90/54  Pulse: 70 74 65   Temp:   98.3 F (36.8 C)   TempSrc:      Resp: 17 24 20    SpO2: 100% 98% 97%     Medications  ondansetron (ZOFRAN-ODT) disintegrating tablet 8 mg (8 mg Oral Given 05/02/14 1347)  sodium chloride 0.9 % bolus 1,000 mL (0 mLs Intravenous Stopped 05/02/14 1753)  metoCLOPramide (REGLAN) injection 10 mg (10 mg Intravenous Given 05/02/14 1720)  dexamethasone (DECADRON) injection 10 mg (10 mg Intravenous Given 05/02/14 1720)  diphenhydrAMINE (BENADRYL) injection 25 mg (25 mg Intravenous Given 05/02/14 1720)    Tammy Howard is a 23 y.o. female presenting with HA. Presentation is like pts typical HA and non concerning for [facility], ICH, Meningitis, or temporal arteritis. Pt is afebrile with no focal neuro  deficits, nuchal rigidity, or change in vision. Pt is to follow up with PCP to discuss prophylactic medication. Pt verbalizes understanding and is agreeable with plan to dc.  Evaluation does not show pathology that would require ongoing emergent intervention or inpatient treatment. Pt is hemodynamically stable and mentating appropriately. Discussed findings and plan with patient/guardian, who agrees with care plan. All questions answered. Return precautions discussed and outpatient follow up given.   New Prescriptions   BUTALBITAL-ACETAMINOPHEN-CAFFEINE (FIORICET) 50-325-40 MG PER TABLET    Take 1 tablet by mouth every 6 (six) hours as needed for headache.   METOCLOPRAMIDE (REGLAN) 10 MG TABLET    Take 1 tablet (10 mg total) by mouth every 6 (six) hours.         Wynetta Emeryicole Avishai Reihl, PA-C 05/02/14 2001

## 2014-05-02 NOTE — ED Notes (Signed)
Pt. Reports migraine x3-4 days, sensativity to light, N/V. States this is typical for migraines for her. Tried to take 2 hydrocodone with no relief.

## 2016-12-28 ENCOUNTER — Ambulatory Visit (INDEPENDENT_AMBULATORY_CARE_PROVIDER_SITE_OTHER): Payer: PPO | Admitting: Surgical Oncology

## 2017-01-16 ENCOUNTER — Encounter (INDEPENDENT_AMBULATORY_CARE_PROVIDER_SITE_OTHER): Payer: Self-pay | Admitting: Surgical Oncology

## 2017-01-16 ENCOUNTER — Ambulatory Visit (HOSPITAL_BASED_OUTPATIENT_CLINIC_OR_DEPARTMENT_OTHER): Payer: PPO | Admitting: Surgical Oncology

## 2017-01-16 ENCOUNTER — Encounter (HOSPITAL_COMMUNITY): Payer: Self-pay

## 2017-01-16 ENCOUNTER — Ambulatory Visit
Admission: RE | Admit: 2017-01-16 | Discharge: 2017-01-16 | Disposition: A | Discharge status: Home / Self Care | Payer: PPO | Source: Ambulatory Visit | Attending: Surgical Oncology | Admitting: Surgical Oncology

## 2017-01-16 VITALS — BP 104/80 | HR 81 | Temp 98.4°F | Ht 63.0 in | Wt 110.0 lb

## 2017-01-16 DIAGNOSIS — R52 Pain, unspecified: Secondary | ICD-10-CM

## 2017-01-16 DIAGNOSIS — N6320 Unspecified lump in the left breast, unspecified quadrant: Secondary | ICD-10-CM

## 2017-01-16 NOTE — Progress Notes (Signed)
BREAST SURGICAL ONCOLOGY HISTORY AND PHYSICAL:    PATIENT:   Quinnetta Roepke     MRN:    [id number]   DOB:    [birth year]  DATE OF SERVICE: 01/16/2017    REFERRING PROVIDER: Guerry Minors  PCP: Given None    CHIEF COMPLAINT: abnormal mammogram    HISTORY OF PRESENT ILLNESS:  Natesha Hassey is a 26 y.o. White female who presents as a new patient to the breast surgery clinic today for evaluation of a left breast abnormal US showing BIRADS 3, ellipsoid mass/breast pain.  Prior to her ultrasound, she denied  skin changes or nipple discharge. She notes LEFT breast pain x 1 year which is achy and comes and goes. Reports it is made worse with stress and not made better by anything. She notes her breast pain is actually better during her menstrual cycle. Her LEFT breast US on 12/09/16 revealed: hypoechoic circumscribed ellipsoid mass in the upper inner breast at the 11:00 position measuring 9x5x8 mm. 6 month follow up ultrasound was recommended. She denies smoking history or caffeine use.   Three-generation maternal and paternal family history is reviewed and she reports no known family history of breast, gynecologic, or colon cancer.      BREAST HISTORY:  Personal history of breast cancer? No.  History of previous biopsies? No.  History of previous breast diseases? No    GYNECOLOGIC HISTORY: Age of menarche was 82. Patient is G0 P0. Age of first live birth was N/A. Age of menopause was N/A.  Patient denies hormonal therapy, breastfeeding, infertility treatment or use of birth control pills.     REVIEW OF SYSTEMS:  Constitutional: no weight loss, fever, night sweats  HEENT:  negative  Cardiovascular:  negative  Respiratory:  negative  GI:  negative  GU:   negative  MS:  negative  Skin:  negative  Heme:  negative  Neuro:  negative  Endocrine:  negative  Psychiatric:   Negative    PAST MEDICAL HISTORY:  has no past medical history on file.  PAST SURGICAL HISTORY: has no past surgical history on file.  FAMILY HISTORY: family history is not  on file.  SOCIAL HISTORY:  reports that she has never smoked. She has never used smokeless tobacco. She reports that she does not drink alcohol.  ALLERGIES: has No Known Allergies.  HOME MEDICATIONS:  No current outpatient prescriptions on file.     PHYSICAL EXAMINATION:  Vital Signs: BP 104/80  Pulse 81  Temp 36.9 C (98.4 F) (Thermal Scan)   Ht 1.6 m (5\' 3" )  Wt 49.9 kg (110 lb 0.2 oz)  SpO2 97% Comment: room air  BMI 19.49 kg/m2  General: pleasant, well-developed, well-nourished White female who appears her stated age and is in no acute distress.    HEENT: normocephalic, atraumatic, PERRLA, EOMI, nose is negative for blood   Neck: supple, trachea is midline  Cardiovascular: regular rate and rhythm, negative for peripheral edema  Pulmonary: normal effort, chest expands symmetrically, no respiratory distress or audible wheezing  Abdomen: Soft, nontender, nondistended  Extremities: without deformity, cyanosis, or edema  Skin: warm, normal  Neurologic: alert and oriented to person, place, time, and situation, cranial nerves grossly intact, no focal motor or sensory deficits  Psychiatric: speech pattern and movements are normal, normal affect and judgment  Lymphatics: no palpable lymphadenopathy of the cervical, supraclavicular, or infraclavicular nodal basins bilaterally  Breast exam:  She is examined seated, supine, and with motion of the pectoralis major muscles bilaterally.  On gross inspection, there are no obvious masses or asymmetries.  The nipples are everted bilaterally.    Right breast: symmetric in shape, Negative for mass  Tenderness is not noted   Skin:  negative for dimpling with movement of the pectoralis  Areola: consistent pigmentation, negative for ulceration.  Nipple: no evidence of ulceration, discharge or retraction  Axilla: no palpable lymphadenopathy  Left breast: symmetric in shape, Negative for mass though palpable nodularity noted at the 11:00 position  Tenderness is mildly noted to  the 11:00 position  Skin: negative for dimpling with movement of the pectoralis  Areola: consistent pigmentation, negative for ulceration.  Nipple: no evidence of ulceration, discharge or retraction  Axilla: no palpable lymphadenopathy    IMAGING REVIEW:  Performed at: [facility], 12/09/16  US LEFT BREAST: hypoechoic circumscribed ellipsoid mass in the upper inner left breast at the 11:00 position measuring 9x5x8 mm.  This is probably benign. BIRADS 3. A six month follow up ultrasound is recommended to reassess.     ASSESSMENT/PLAN:    ICD-10-CM    1. Pain R52 US Breast Left   2. Breast mass, left N63.20      26 y.o.-year-old caucasian woman presents for recently detected LEFT breast mass at the 11:00 position. She has no family history of breast or ovarian cancers. [facility] revealed an ellipsoid mass at the 11:00 position  and 5813-month followup (BI-RADS 3) was recommended.  Her image report was reviewed with her today. She does have a palpable density at the 11:00 position of the LEFT breast on today's examination which is mildly tender to palpation and likely contributing to her breast pain. Since she does have a palpable finding on examination and her imaging was performed at an outside facility, we will send her today for a [facility]. Assuming this imaging is negative, she will RTC in 6 months for repeat clinical examination and repeat imaging. The patient was given the opportunity to ask questions and agrees with the recommendations. She should return to the clinic sooner with questions, concerns or changes.     Px was seen with cosigning faculty, Dr. Garrison ColumbusLupinacci.  Chauncey FischerKatlin Bates, PA-C  01/16/2017, 14:09    No PCP on file to send letter    Recent Results (from the past [id number] hour(s))   US Breast Left    Collection Time: 01/16/17  2:40 PM    Narrative    FILMS COMPARED:  No comparisons were made when reading this  study.    HISTORY:  Procedure: US BREAST LEFT LIMITED  Reason for exam: Pain      FINDINGS:    Left  Mass: There is a 20 mm x 6 mm x 12 mm oval, parallel, hypoechoic mass with   circumscribed margins seen in the left breast at 12 o'clock, 6 cm from the   nipple. The mass is in the location of pain reported by the patient.  Mass: There is an 8 mm x 5 mm x 9 mm oval, parallel, hypoechoic mass with   circumscribed margins seen in the left breast at 12 o'clock, 6 cm from the   nipple. The mass is in the location of pain reported by the patient.    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    A followup ultrasound in 6 months is recommended to confirm stability.      Impression  Ultrasound follow up in 6 months is recommended for the left breast.    BI-RADS ATLAS category (left): 3 Probably Benign     I personally examined and evaluated the patient.  I reviewed the APP's note and agree with the findings and plan of care as documented above.  Any exceptions/additions are edited/noted.  Issabelle Mcraney is a 26 y.o. White female who presents for evaluation of LEFT breast pain and BIRADS 3 mass found on LEFT breast US done outside on 12/09/16.  On clinical exam today, she has a palpable vague density in the LEFT breast at 11 o'clock at the site of palpable concern and image-detected mass.  She complains of intermittent LEFT breast pain and palpable lump for about a year, that changes throughout the month with her cycle.  She was sent to [facility] for repeat LEFT breast US, which showed 2 masses (20mm and 9mm) at 11 o'clock, with characteristics compatible with fibroadenomas, with BIRADS 3 recommendations for repeat LEFT breast US in 6 months.  She will return to clinic at that time for repeat clinical exam, sooner if changes occur.    She was given the opportunity to ask questions and agrees with the treatment recommendations.  She has my contact information and she was asked to call with new  or worsening symptoms, questions, or concerns.  Greater than 50% of the 45 minute visit was spent in counseling and coordination of care.      Lanice Shirts Malaika Arnall, DO 01/16/2017, 15:28

## 2017-01-17 ENCOUNTER — Telehealth (INDEPENDENT_AMBULATORY_CARE_PROVIDER_SITE_OTHER): Payer: Self-pay | Admitting: Medical

## 2017-01-17 DIAGNOSIS — N63 Unspecified lump in unspecified breast: Secondary | ICD-10-CM

## 2017-01-17 NOTE — Telephone Encounter (Signed)
Breast Clinic Update:    Sydney FreestoneCalled Sydney Cole to review results of LEFT breast US 01/16/17. Breast US reveals two masses both at 12:00. 6 cm from the nipple at the patient's area of pain. The first is  2.0 cm in size while the second is 0.8cm in size. 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 These results were discussed with the patient. She will RTC in 6 months for a repeat clinical examination with Dr. Garrison ColumbusLupinacci as well as repeat LEFT breast US to document stability of these lesions. We reviewed symptomatic therapies for her breast pain, likely a results of these breast masses ( supportive bra, ice, heat, tylenol/ibuprofen prn). She was agreeable to the above mentioned plan and will return to clinic sooner if needed should she develop questions/concerns/changes to her LEFT breast.    Chauncey FischerKatlin Bates, PA-C  01/17/2017, 13:07

## 2017-02-17 ENCOUNTER — Encounter (INDEPENDENT_AMBULATORY_CARE_PROVIDER_SITE_OTHER): Payer: Self-pay | Admitting: Obstetrics & Gynecology

## 2017-02-17 ENCOUNTER — Ambulatory Visit (INDEPENDENT_AMBULATORY_CARE_PROVIDER_SITE_OTHER): Payer: PPO | Admitting: Obstetrics & Gynecology

## 2017-02-17 VITALS — BP 98/68 | Ht 63.0 in | Wt 110.5 lb

## 2017-02-17 DIAGNOSIS — N979 Female infertility, unspecified: Secondary | ICD-10-CM

## 2017-02-20 NOTE — H&P (Signed)
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 Menarche at age 9 with secondary sexual development aat the same time.  Reports regular cyclic menses q 28-32 days with 5 days of bleeding.  Complaints of moderate dysmenorrhea, describedd as 6/10 which decreases with the onset of bleeding.  Denies dysuria/dyschezia/dyspareunia. No changes in bowel habits at the time of menses.  OB HX: G0P0  GYN HX: + trichimonial infection. - other STDs/-IUD/-abnormal Paps. Last Pap 2/18 and normal per patient report.  PMH: 1) H/O left breast pain. No diagnosis and still extant.  Allergies: NKDA  PSH: NC  Fam HX: 62 and 43 year old sisters both without children by choice; 59 year old bropther with 2 children; 23 year old brother without children by choice. Mother is 19 and of ?? Menstrual status??. 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  -tobacco/Social EtOH/-drugs  ROS: Negative except as listed above  No labs forwarded.    Partner  Healthy 25 year old female  PMH: NC  Allergies: NKDA  PSH: Wisdom teeth  Fam HX: NC  No children from another relationship  No history of infection/trauma/surgery/mumps orchitis.  SH: [employer] without toxic exposures. -tobacco/Social EtOH/-drugs.    PE  WDWN female in NAD  HEENT: PERRLA; - JVD, HJR, acanthosis, thyromegaly  Skin: 0 lesions  Chest: CTAP  CV: S1, S2 0 murmurs, rubs, gallops  Back: 0 CVAT  Breasts: Symmetrical without discharge  Abd: Benign without masses, HSM, rebound  Ext: 0 C/C/E  Neuro: Non-focal  Lymph: Non-focal  GYN: NEFG: BUS-  Vagina: Clear  Cervix: - CMT  Corpus: NSSC/AV/AF/NT/Mobile  Adnexae: Benign bilaterally    Pelvic Sonography:    Method: TV    Uterus:   Position: Midplane   Length: 47.56 mm   Width: 45.09 mm   Height: 29.76 mm   Cervical length: 40.18 mm   Endometrium: 13.47 mm Type I follicular  Right Ovary:   Length: 33.83 mm   Width: 33.78 mm   Height: 42.25 mm   Volume:    25.29 cm3   Follicles:  1.  Quiescent  Left ovary:   Length: 42.49 mm   Width: 22.83 mm   Height: 37.64 mm   Volume:   19.12 cm3   Follicles:  1.  Quiescent  Fluid: None    Impression  1. Normal scan    Primary infertility  RUle out female factor  Rule out hormonal factor  P: Day 3 labs  S.A.  We discussed possibilities of care to include use of oral or injectable meds +/- IUI vs IVF  With this patient, I discussed her diagnosis, testing and  her plan of care. I spent a total of 60 minutes speaking with her with greater than 50% of that time counseling and coordinating care.

## 2017-04-24 ENCOUNTER — Emergency Department (HOSPITAL_BASED_OUTPATIENT_CLINIC_OR_DEPARTMENT_OTHER)
Admission: EM | Admit: 2017-04-24 | Discharge: 2017-04-24 | Disposition: A | Discharge status: Home / Self Care | Payer: Self-pay | Attending: Emergency Medicine | Admitting: Emergency Medicine

## 2017-04-24 ENCOUNTER — Encounter (HOSPITAL_BASED_OUTPATIENT_CLINIC_OR_DEPARTMENT_OTHER): Payer: Self-pay | Admitting: *Deleted

## 2017-04-24 DIAGNOSIS — Z79899 Other long term (current) drug therapy: Secondary | ICD-10-CM | POA: Insufficient documentation

## 2017-04-24 DIAGNOSIS — L299 Pruritus, unspecified: Secondary | ICD-10-CM | POA: Insufficient documentation

## 2017-04-24 MED ORDER — ONDANSETRON 4 MG PO TBDP
4.0000 mg | ORAL_TABLET | Freq: Once | ORAL | Status: AC
  Administered 2017-04-24: 4 mg via ORAL
  Filled 2017-04-24: qty 1

## 2017-04-24 MED ORDER — TRIAMCINOLONE ACETONIDE 0.1 % EX CREA: 1.0000 "application " | TOPICAL_CREAM | Freq: Two times a day (BID) | CUTANEOUS | 0 refills | Status: DC

## 2017-04-24 MED ORDER — HYDROXYZINE HCL 25 MG PO TABS: 25.0000 mg | ORAL_TABLET | Freq: Three times a day (TID) | ORAL | 0 refills | Status: DC | PRN

## 2017-04-24 MED ORDER — HYDROXYZINE HCL 10 MG/5ML PO SYRP
ORAL_SOLUTION | ORAL | Status: AC
  Filled 2017-04-24: qty 1

## 2017-04-24 MED ORDER — HYDROXYZINE HCL 10 MG PO TABS
10.0000 mg | ORAL_TABLET | Freq: Once | ORAL | Status: AC
  Administered 2017-04-24: 10 mg via ORAL
  Filled 2017-04-24: qty 1

## 2017-04-24 NOTE — Discharge Instructions (Signed)
Please use the steroid cream to help with the itching. Do not apply to the face or genital area. Use hydroxyzine for itching. This medication will make you drowsy. Use over-the-counter Zantac or Pepcid to help with itching. Please wash all bedding and clothing and hot water after putting a trash pack for 24 hours. Follow-up with her primary care doctor or return to the ED if her symptoms worsen or do not improve.

## 2017-04-24 NOTE — ED Triage Notes (Signed)
Itching x 2 weeks. States she thinks she has mites. Her husband does not itch and they sleep in the same bed. No rash.

## 2017-04-25 NOTE — ED Provider Notes (Signed)
[facility]-EMERGENCY DEPT Provider Note   CSN: [id number] Arrival date & time: 04/24/17  1021     History   Chief Complaint Chief Complaint  Patient presents with  . Pruritis    HPI Tammy Howard is a 26 y.o. female.  HPI 26 year old Caucasian female with no significant past medical history presents to the emergency Department today with complaints of bug bites, pruritus for the past 2 weeks. Patient states that for the past 2 weeks she has had generalized pruritus with intermittent red bumps,. States that she is staying in a hotel prior to her symptoms starting. She also endorses starting a new laundry detergent. Patient does bring the piece of tape and with what she states are mites on and thinks that she has bedbugs or mites. She also thinks that might certainly from the mass infestation that she has. She also states that her symptoms could be due to flea bites. States that her dogs are itching as well. Patient has not noticed any bugs in her bed. He states that her husband has no symptoms. Patient states she has tried Benadryl with little relief. Nothing makes better or worse. Patient is very upset and crying in the room about mice and mite infestation at home. Patient denies any abdominal pain, scleral icterus, urinary symptoms, rash. Past Medical History:  Diagnosis Date  . Migraine     There are no active problems to display for this patient.   Past Surgical History:  Procedure Laterality Date  . EYE SURGERY      OB History    No data available       Home Medications    Prior to Admission medications   Medication Sig Start Date End Date Taking? Authorizing Provider  diphenhydrAMINE (BENADRYL) 25 mg capsule Take 25 mg by mouth every 6 (six) hours as needed.   Yes [provider]  norgestimate-ethinyl estradiol (ORTHO-CYCLEN,SPRINTEC,PREVIFEM) 0.25-35 MG-MCG tablet Take 1 tablet by mouth daily.   Yes [provider]  HYDROcodone-acetaminophen (NORCO)  7.5-325 MG per tablet Take 1 tablet by mouth every 6 (six) hours as needed for moderate pain.    [provider]  hydrOXYzine (ATARAX/VISTARIL) 25 MG tablet Take 1 tablet (25 mg total) by mouth 3 (three) times daily as needed. 04/24/17   Rise Mu, PA-C  metoCLOPramide (REGLAN) 10 MG tablet Take 1 tablet (10 mg total) by mouth every 6 (six) hours. 05/02/14   Pisciotta, Joni Reining, PA-C  triamcinolone cream (KENALOG) 0.1 % Apply 1 application topically 2 (two) times daily. 04/24/17   Rise Mu, PA-C    Family History No family history on file.  Social History Social History  Substance Use Topics  . Smoking status: Never Smoker  . Smokeless tobacco: Never Used  . Alcohol use Yes     Comment: rarely     Allergies   Bee venom   Review of Systems Review of Systems  Constitutional: Negative for chills and fever.  HENT: Negative for congestion.   Gastrointestinal: Negative for abdominal pain, nausea and vomiting.  Musculoskeletal: Negative for myalgias.  Skin: Positive for rash.  Neurological: Negative for headaches.     Physical Exam Updated Vital Signs BP 122/72   Pulse 78   Temp 98 F (36.7 C) (Oral)   Resp 18   Ht 5\' 5"  (1.651 m)   Wt 122.5 kg (270 lb)   LMP 04/17/2017   SpO2 100%   BMI 44.93 kg/m   Physical Exam  Constitutional: She appears well-developed  and well-nourished. No distress.  Patient appears very upset and is crying in the room.  HENT:  Head: Normocephalic and atraumatic.  Eyes: Right eye exhibits no discharge. Left eye exhibits no discharge. No scleral icterus.  Neck: Normal range of motion.  Pulmonary/Chest: No respiratory distress.  Abdominal: Soft. Bowel sounds are normal. She exhibits no mass. There is no tenderness. There is no rebound and no guarding.  Musculoskeletal: Normal range of motion.  Neurological: She is alert.  Skin: Skin is warm and dry. Capillary refill takes less than 2 seconds. No pallor.  Patient with  several erythematous papular bites to her anterior thighs, back, neck. no signs of cellulitis. No other rash noted.  Psychiatric: Her behavior is normal. Judgment and thought content normal.  Appears anxious  Nursing note and vitals reviewed.    ED Treatments / Results  Labs (all labs ordered are listed, but only abnormal results are displayed) Labs Reviewed - No data to display  EKG  EKG Interpretation None       Radiology No results found.  Procedures Procedures (including critical care time)  Medications Ordered in ED Medications  hydrOXYzine (ATARAX/VISTARIL) tablet 10 mg (10 mg Oral Given 04/24/17 1149)  ondansetron (ZOFRAN-ODT) disintegrating tablet 4 mg (4 mg Oral Given 04/24/17 1148)  hydrOXYzine (ATARAX) 10 MG/5ML syrup (0 mg  [facility]     Initial Impression / Assessment and Plan / ED Course  I have reviewed the triage vital signs and the nursing notes.  Pertinent labs & imaging results that were available during my care of the patient were reviewed by me and considered in my medical decision making (see chart for details).     Patient presents to the emergency Department today with complaints of pruritus, possible bug bites. States that she does have mice in her house and is concerned for Mites. Has a piece of tape with what she states are mites on the table. Patient does have several small red what appeared to be bug bites throughout her entire body. She does have generalized pruritus. No scleral icterus. No other rash noted. No abdominal pain. The patient also notes starting Future to around time of symptoms starting. Have treated patient with hydroxyzine and triamcinolone cream. Encouraged to use over-the-counter Pepcid or Zantac. Vital signs are normal. She is afebrile. Doubt patient's pruritus is due to liver disease however instruct patient that she does not feel better the next 2-3 days to return to the ED for evaluation of possible blood  work at that time. Patient is hemodynamically stable in no acute distress on discharge. She understands and verbalizes agreement with discharge instructions.  Final Clinical Impressions(s) / ED Diagnoses   Final diagnoses:  Pruritus    New Prescriptions Discharge Medication List as of 04/24/2017 11:20 AM    START taking these medications   Details  hydrOXYzine (ATARAX/VISTARIL) 25 MG tablet Take 1 tablet (25 mg total) by mouth 3 (three) times daily as needed., Starting Mon 04/24/2017, Print    triamcinolone cream (KENALOG) 0.1 % Apply 1 application topically 2 (two) times daily., Starting Mon 04/24/2017, Print         Rise MuLeaphart, Lucan Riner T, PA-C 04/25/17 [id number]    Doug SouJacubowitz, Sam, MD 04/26/17 1739

## 2017-04-26 ENCOUNTER — Emergency Department (HOSPITAL_BASED_OUTPATIENT_CLINIC_OR_DEPARTMENT_OTHER)
Admission: EM | Admit: 2017-04-26 | Discharge: 2017-04-26 | Disposition: A | Discharge status: Home / Self Care | Payer: Self-pay | Attending: Emergency Medicine | Admitting: Emergency Medicine

## 2017-04-26 ENCOUNTER — Encounter (HOSPITAL_BASED_OUTPATIENT_CLINIC_OR_DEPARTMENT_OTHER): Payer: Self-pay | Admitting: Emergency Medicine

## 2017-04-26 DIAGNOSIS — R202 Paresthesia of skin: Secondary | ICD-10-CM | POA: Insufficient documentation

## 2017-04-26 DIAGNOSIS — R112 Nausea with vomiting, unspecified: Secondary | ICD-10-CM | POA: Insufficient documentation

## 2017-04-26 DIAGNOSIS — Z79899 Other long term (current) drug therapy: Secondary | ICD-10-CM | POA: Insufficient documentation

## 2017-04-26 LAB — URINALYSIS, ROUTINE W REFLEX MICROSCOPIC
Glucose, UA: NEGATIVE mg/dL
Ketones, ur: >80 mg/dL — AB
Nitrite: NEGATIVE
Protein, ur: NEGATIVE mg/dL
Specific Gravity, Urine: 1.029 (ref 1.005–1.030)
pH: 5.5 (ref 5.0–8.0)

## 2017-04-26 LAB — CBC WITH DIFFERENTIAL/PLATELET
Basophils Absolute: 0 10*3/uL (ref 0.0–0.1)
Basophils Relative: 0 %
Eosinophils Absolute: 0.1 10*3/uL (ref 0.0–0.7)
Eosinophils Relative: 1 %
HCT: 43.4 % (ref 36.0–46.0)
Hemoglobin: 14.5 g/dL (ref 12.0–15.0)
Lymphocytes Relative: 32 %
Lymphs Abs: 1.8 10*3/uL (ref 0.7–4.0)
MCH: 29.1 pg (ref 26.0–34.0)
MCHC: 33.4 g/dL (ref 30.0–36.0)
MCV: 87 fL (ref 78.0–100.0)
Monocytes Absolute: 0.5 10*3/uL (ref 0.1–1.0)
Monocytes Relative: 9 %
Neutro Abs: 3.3 10*3/uL (ref 1.7–7.7)
Neutrophils Relative %: 58 %
Platelets: 307 10*3/uL (ref 150–400)
RBC: 4.99 MIL/uL (ref 3.87–5.11)
RDW: 13.1 % (ref 11.5–15.5)
WBC: 5.6 10*3/uL (ref 4.0–10.5)

## 2017-04-26 LAB — COMPREHENSIVE METABOLIC PANEL
ALT: 23 U/L (ref 14–54)
AST: 23 U/L (ref 15–41)
Albumin: 4.4 g/dL (ref 3.5–5.0)
Alkaline Phosphatase: 63 U/L (ref 38–126)
Anion gap: 12 (ref 5–15)
BUN: 12 mg/dL (ref 6–20)
CO2: 26 mmol/L (ref 22–32)
Calcium: 9.2 mg/dL (ref 8.9–10.3)
Chloride: 101 mmol/L (ref 101–111)
Creatinine, Ser: 0.7 mg/dL (ref 0.44–1.00)
GFR calc Af Amer: >60 mL/min (ref >60)
GFR calc non Af Amer: >60 mL/min (ref >60)
Glucose, Bld: 96 mg/dL (ref 65–99)
Potassium: 3.2 mmol/L — ABNORMAL LOW (ref 3.5–5.1)
Sodium: 139 mmol/L (ref 135–145)
Total Bilirubin: 0.9 mg/dL (ref 0.3–1.2)
Total Protein: 8 g/dL (ref 6.5–8.1)

## 2017-04-26 LAB — PREGNANCY, URINE: Preg Test, Ur: NEGATIVE

## 2017-04-26 LAB — URINALYSIS, MICROSCOPIC (REFLEX)

## 2017-04-26 MED ORDER — LORAZEPAM 0.5 MG PO TABS: 0.5000 mg | ORAL_TABLET | Freq: Three times a day (TID) | ORAL | 0 refills | Status: DC | PRN

## 2017-04-26 MED ORDER — ONDANSETRON HCL 4 MG/2ML IJ SOLN
4.0000 mg | Freq: Once | INTRAMUSCULAR | Status: AC
  Administered 2017-04-26: 4 mg via INTRAVENOUS
  Filled 2017-04-26: qty 2

## 2017-04-26 MED ORDER — SODIUM CHLORIDE 0.9 % IV BOLUS (SEPSIS)
1000.0000 mL | Freq: Once | INTRAVENOUS | Status: AC
  Administered 2017-04-26: 1000 mL via INTRAVENOUS

## 2017-04-26 MED ORDER — LORAZEPAM 2 MG/ML IJ SOLN
0.5000 mg | Freq: Once | INTRAMUSCULAR | Status: AC
  Administered 2017-04-26: 0.5 mg via INTRAVENOUS
  Filled 2017-04-26: qty 1

## 2017-04-26 NOTE — ED Notes (Signed)
Pt reports "feeling like something is crawling all over her" for past week.  Pt also reports nausea for past week, worse in the morning.

## 2017-04-26 NOTE — ED Provider Notes (Signed)
[facility]   CSN: [id number] Arrival date & time: 04/26/17  0654     History   Chief Complaint Chief Complaint  Patient presents with  . Emesis    HPI Tammy Howard is a 26 y.o. female.  Pt presents to the ED today with itching and nausea.  Pt has felt like bugs were crawling all over her for 2 weeks.  She came to the ED yesterday for the same.  She was given rx for triamcinolone cream and atarax.  The pt said those medications helped a little, but she still felt like there were bugs crawling on her.  Pt said the house has fleas and thinks they are in her mattress.  She has not gotten a mattress cover, but plans to.  Pt's husband has no sx.  The pt feels there are bugs crawling on her now in the ED.  The nausea has been every morning and she thinks she may be pregnant although she is finishing with her period now.      Past Medical History:  Diagnosis Date  . Migraine     There are no active problems to display for this patient.   Past Surgical History:  Procedure Laterality Date  . EYE SURGERY      OB History    No data available       Home Medications    Prior to Admission medications   Medication Sig Start Date End Date Taking? Authorizing Provider  diphenhydrAMINE (BENADRYL) 25 mg capsule Take 25 mg by mouth every 6 (six) hours as needed.    [provider]  HYDROcodone-acetaminophen (NORCO) 7.5-325 MG per tablet Take 1 tablet by mouth every 6 (six) hours as needed for moderate pain.    [provider]  hydrOXYzine (ATARAX/VISTARIL) 25 MG tablet Take 1 tablet (25 mg total) by mouth 3 (three) times daily as needed. 04/24/17   Rise Mu, PA-C  LORazepam (ATIVAN) 0.5 MG tablet Take 1 tablet (0.5 mg total) by mouth every 8 (eight) hours as needed for anxiety. 04/26/17   Jacalyn Lefevre, MD  metoCLOPramide (REGLAN) 10 MG tablet Take 1 tablet (10 mg total) by mouth every 6 (six) hours. 05/02/14   Pisciotta, Joni Reining, PA-C   norgestimate-ethinyl estradiol (ORTHO-CYCLEN,SPRINTEC,PREVIFEM) 0.25-35 MG-MCG tablet Take 1 tablet by mouth daily.    [provider]  triamcinolone cream (KENALOG) 0.1 % Apply 1 application topically 2 (two) times daily. 04/24/17   Rise Mu, PA-C    Family History No family history on file.  Social History Social History  Substance Use Topics  . Smoking status: Never Smoker  . Smokeless tobacco: Never Used  . Alcohol use Yes     Comment: rarely     Allergies   Bee venom   Review of Systems Review of Systems  Gastrointestinal: Positive for nausea.  Skin:       pruritis  All other systems reviewed and are negative.    Physical Exam Updated Vital Signs BP 129/81 (BP Location: Left Arm)   Pulse 82   Temp 98.5 F (36.9 C) (Oral)   Resp 18   LMP 04/20/2017 (Exact Date)   SpO2 99%   Physical Exam  Constitutional: She is oriented to person, place, and time. She appears well-developed and well-nourished.  HENT:  Head: Normocephalic and atraumatic.  Right Ear: External ear normal.  Left Ear: External ear normal.  Nose: Nose normal.  Mouth/Throat: Oropharynx is clear and moist.  Eyes:  Conjunctivae and EOM are normal. Pupils are equal, round, and reactive to light.  Neck: Normal range of motion. Neck supple.  Cardiovascular: Normal rate, regular rhythm, normal heart sounds and intact distal pulses.   Pulmonary/Chest: Effort normal and breath sounds normal.  Abdominal: Soft. Bowel sounds are normal.  Musculoskeletal: Normal range of motion.  Neurological: She is alert and oriented to person, place, and time.  Skin: Skin is warm.  Psychiatric: Her mood appears anxious.  Nursing note and vitals reviewed.    ED Treatments / Results  Labs (all labs ordered are listed, but only abnormal results are displayed) Labs Reviewed  URINALYSIS, ROUTINE W REFLEX MICROSCOPIC - Abnormal; Notable for the following:       Result Value   Color, Urine AMBER (*)     APPearance CLOUDY (*)    Hgb urine dipstick LARGE (*)    Bilirubin Urine SMALL (*)    Ketones, ur >80 (*)    Leukocytes, UA SMALL (*)    All other components within normal limits  COMPREHENSIVE METABOLIC PANEL - Abnormal; Notable for the following:    Potassium 3.2 (*)    All other components within normal limits  URINALYSIS, MICROSCOPIC (REFLEX) - Abnormal; Notable for the following:    Bacteria, UA MANY (*)    Squamous Epithelial / LPF 6-30 (*)    All other components within normal limits  CBC WITH DIFFERENTIAL/PLATELET  PREGNANCY, URINE   Hematuria is from period.  EKG  EKG Interpretation None       Radiology No results found.  Procedures Procedures (including critical care time)  Medications Ordered in ED Medications  sodium chloride 0.9 % bolus 1,000 mL (1,000 mLs Intravenous New Bag/Given 04/26/17 0726)  ondansetron (ZOFRAN) injection 4 mg (4 mg Intravenous Given 04/26/17 0726)  LORazepam (ATIVAN) injection 0.5 mg (0.5 mg Intravenous Given 04/26/17 0735)     Initial Impression / Assessment and Plan / ED Course  I have reviewed the triage vital signs and the nursing notes.  Pertinent labs & imaging results that were available during my care of the patient were reviewed by me and considered in my medical decision making (see chart for details).    Pt is feeling better after the ativan and IVFs.  She is encouraged to get a mattress cover and to take care of her house's insect problem.  She knows to return if worse.  Final Clinical Impressions(s) / ED Diagnoses   Final diagnoses:  Formication  Nausea and vomiting, intractability of vomiting not specified, unspecified vomiting type    New Prescriptions New Prescriptions   LORAZEPAM (ATIVAN) 0.5 MG TABLET    Take 1 tablet (0.5 mg total) by mouth every 8 (eight) hours as needed for anxiety.     Jacalyn LefevreHaviland, Violanda Bobeck, MD 04/26/17 [phone]

## 2017-04-26 NOTE — ED Notes (Addendum)
Pt also reports "brown patch" on URQ for past 3 weeks.  Subtle, very faint area of increased pigmentation noted to URQ, nontender.

## 2017-04-26 NOTE — ED Triage Notes (Signed)
Pt reports n/v/d x 1 week. Pt also states she "feels like bugs are crawling on me." c/o itching all over. Pt was seen for the itching last week here.

## 2017-07-24 ENCOUNTER — Encounter (INDEPENDENT_AMBULATORY_CARE_PROVIDER_SITE_OTHER): Payer: PPO | Admitting: Surgical Oncology

## 2017-07-24 ENCOUNTER — Ambulatory Visit (HOSPITAL_BASED_OUTPATIENT_CLINIC_OR_DEPARTMENT_OTHER): Payer: PPO | Admitting: Surgical Oncology

## 2017-07-24 ENCOUNTER — Other Ambulatory Visit (HOSPITAL_COMMUNITY): Payer: Self-pay

## 2017-09-05 ENCOUNTER — Other Ambulatory Visit: Payer: Self-pay

## 2017-09-05 ENCOUNTER — Encounter (HOSPITAL_BASED_OUTPATIENT_CLINIC_OR_DEPARTMENT_OTHER): Payer: Self-pay

## 2017-09-05 ENCOUNTER — Emergency Department (HOSPITAL_BASED_OUTPATIENT_CLINIC_OR_DEPARTMENT_OTHER)
Admission: EM | Admit: 2017-09-05 | Discharge: 2017-09-05 | Disposition: A | Discharge status: Home / Self Care | Payer: Self-pay | Attending: Emergency Medicine | Admitting: Emergency Medicine

## 2017-09-05 DIAGNOSIS — N939 Abnormal uterine and vaginal bleeding, unspecified: Secondary | ICD-10-CM | POA: Insufficient documentation

## 2017-09-05 DIAGNOSIS — N946 Dysmenorrhea, unspecified: Secondary | ICD-10-CM | POA: Insufficient documentation

## 2017-09-05 DIAGNOSIS — F1210 Cannabis abuse, uncomplicated: Secondary | ICD-10-CM | POA: Insufficient documentation

## 2017-09-05 LAB — CBC WITH DIFFERENTIAL/PLATELET
Basophils Absolute: 0 10*3/uL (ref 0.0–0.1)
Basophils Relative: 0 %
Eosinophils Absolute: 0.1 10*3/uL (ref 0.0–0.7)
Eosinophils Relative: 1 %
HCT: 41.4 % (ref 36.0–46.0)
Hemoglobin: 13.7 g/dL (ref 12.0–15.0)
Lymphocytes Relative: 36 %
Lymphs Abs: 3.5 10*3/uL (ref 0.7–4.0)
MCH: 29.1 pg (ref 26.0–34.0)
MCHC: 33.1 g/dL (ref 30.0–36.0)
MCV: 87.9 fL (ref 78.0–100.0)
Monocytes Absolute: 0.7 10*3/uL (ref 0.1–1.0)
Monocytes Relative: 7 %
Neutro Abs: 5.4 10*3/uL (ref 1.7–7.7)
Neutrophils Relative %: 56 %
Platelets: 325 10*3/uL (ref 150–400)
RBC: 4.71 MIL/uL (ref 3.87–5.11)
RDW: 13.2 % (ref 11.5–15.5)
WBC: 9.7 10*3/uL (ref 4.0–10.5)

## 2017-09-05 LAB — URINALYSIS, ROUTINE W REFLEX MICROSCOPIC
Bilirubin Urine: NEGATIVE
Glucose, UA: NEGATIVE mg/dL
Hgb urine dipstick: NEGATIVE
Ketones, ur: NEGATIVE mg/dL
Leukocytes, UA: NEGATIVE
Nitrite: NEGATIVE
Protein, ur: NEGATIVE mg/dL
Specific Gravity, Urine: >1.03 — ABNORMAL HIGH (ref 1.005–1.030)
pH: 6 (ref 5.0–8.0)

## 2017-09-05 LAB — PREGNANCY, URINE: Preg Test, Ur: NEGATIVE

## 2017-09-05 MED ORDER — ONDANSETRON 4 MG PO TBDP
4.0000 mg | ORAL_TABLET | Freq: Once | ORAL | Status: AC
  Administered 2017-09-05: 4 mg via ORAL
  Filled 2017-09-05: qty 1

## 2017-09-05 MED ORDER — ONDANSETRON 4 MG PO TBDP: 4.0000 mg | ORAL_TABLET | Freq: Three times a day (TID) | ORAL | 0 refills | Status: DC | PRN

## 2017-09-05 MED ORDER — NAPROXEN 500 MG PO TABS: 500.0000 mg | ORAL_TABLET | Freq: Two times a day (BID) | ORAL | 0 refills | Status: DC

## 2017-09-05 MED ORDER — KETOROLAC TROMETHAMINE 30 MG/ML IJ SOLN
30.0000 mg | Freq: Once | INTRAMUSCULAR | Status: AC
  Administered 2017-09-05: 30 mg via INTRAMUSCULAR
  Filled 2017-09-05: qty 1

## 2017-09-05 NOTE — Discharge Instructions (Signed)
[facility], or OB/GYN of your choice for follow-up exam. Naproxen as needed for menstrual and pelvic related pain.

## 2017-09-05 NOTE — ED Provider Notes (Signed)
[facility] EMERGENCY DEPARTMENT Provider Note   CSN: [id number] Arrival date & time: 09/05/17  1846     History   Chief Complaint Chief Complaint  Patient presents with  . Abdominal Pain    HPI Tammy Howard is a 26 y.o. female. Chief complaint is abdominal pain, vaginal bleeding.  HPI: 26 year old female. Is on birth control for 6 years. He came off in July. Had regular periods for a few months. Does not weeks late for her. Has pelvic cramping and vaginal bleeding today. States she became concerned because she heard "something about polycystic ovaries". Stop birth control because she her husband are trying to conceive.  Past Medical History:  Diagnosis Date  . Migraine     There are no active problems to display for this patient.   Past Surgical History:  Procedure Laterality Date  . EYE SURGERY      OB History    No data available       Home Medications    Prior to Admission medications   Medication Sig Start Date End Date Taking? Authorizing Provider  naproxen (NAPROSYN) 500 MG tablet Take 1 tablet (500 mg total) 2 (two) times daily by mouth. 09/05/17   Rolland PorterJames, Jekhi Bolin, MD  ondansetron (ZOFRAN ODT) 4 MG disintegrating tablet Take 1 tablet (4 mg total) every 8 (eight) hours as needed by mouth for nausea. 09/05/17   Rolland PorterJames, Amyla Heffner, MD    Family History No family history on file.  Social History Social History   Tobacco Use  . Smoking status: Never Smoker  . Smokeless tobacco: Never Used  Substance Use Topics  . Alcohol use: Yes    Comment: rarely  . Drug use: Yes    Types: Marijuana     Allergies   Bee venom   Review of Systems Review of Systems  Constitutional: Negative for appetite change, chills, diaphoresis, fatigue and fever.  HENT: Negative for mouth sores, sore throat and trouble swallowing.   Eyes: Negative for visual disturbance.  Respiratory: Negative for cough, chest tightness, shortness of breath and wheezing.     Cardiovascular: Negative for chest pain.  Gastrointestinal: Negative for abdominal distention, abdominal pain, diarrhea, nausea and vomiting.  Endocrine: Negative for polydipsia, polyphagia and polyuria.  Genitourinary: Positive for pelvic pain and vaginal bleeding. Negative for dysuria, frequency and hematuria.  Musculoskeletal: Negative for gait problem.  Skin: Negative for color change, pallor and rash.  Neurological: Negative for dizziness, syncope, light-headedness and headaches.  Hematological: Does not bruise/bleed easily.  Psychiatric/Behavioral: Negative for behavioral problems and confusion.     Physical Exam Updated Vital Signs BP 135/75 (BP Location: Right Arm)   Pulse 72   Temp 99.2 F (37.3 C) (Oral)   Resp 18   Wt 123.4 kg (272 lb)   LMP 07/20/2017   SpO2 99%   BMI 45.26 kg/m   Physical Exam  Constitutional: She is oriented to person, place, and time. She appears well-developed and well-nourished. No distress.  HENT:  Head: Normocephalic and atraumatic.  Eyes: Conjunctivae are normal. Pupils are equal, round, and reactive to light. No scleral icterus.  Neck: Normal range of motion. Neck supple. No thyromegaly present.  Cardiovascular: Normal rate and regular rhythm. Exam reveals no gallop and no friction rub.  No murmur heard. Pulmonary/Chest: Effort normal and breath sounds normal. No respiratory distress. She has no wheezes. She has no rales.  Abdominal: Soft. Bowel sounds are normal. She exhibits no distension. There is no tenderness. There is no  rebound.  Soft benign abdomen. Conjunctiva not pale. Not tachycardic.  Musculoskeletal: Normal range of motion. She exhibits no edema.  Neurological: She is alert and oriented to person, place, and time.  Skin: Skin is warm and dry. No rash noted.  Psychiatric: She has a normal mood and affect. Her behavior is normal.  Nursing note and vitals reviewed.    ED Treatments / Results  Labs (all labs ordered are  listed, but only abnormal results are displayed) Labs Reviewed  URINALYSIS, ROUTINE W REFLEX MICROSCOPIC - Abnormal; Notable for the following components:      Result Value   Specific Gravity, Urine >1.030 (*)    All other components within normal limits  PREGNANCY, URINE  CBC WITH DIFFERENTIAL/PLATELET    EKG  EKG Interpretation None       Radiology No results found.  Procedures Procedures (including critical care time)  Medications Ordered in ED Medications  ondansetron (ZOFRAN-ODT) disintegrating tablet 4 mg (4 mg Oral Given 09/05/17 2149)  ketorolac (TORADOL) 30 MG/ML injection 30 mg (30 mg Intramuscular Given 09/05/17 2149)     Initial Impression / Assessment and Plan / ED Course  I have reviewed the triage vital signs and the nursing notes.  Pertinent labs & imaging results that were available during my care of the patient were reviewed by me and considered in my medical decision making (see chart for details).    Patient lines pelvic exam. CBC normal. Not pregnant. Given IM Toradol. Symptoms improved. Plan GYN follow-up. Naproxen as needed for pelvic/PMS/menstrual symptoms  Final Clinical Impressions(s) / ED Diagnoses   Final diagnoses:  Dysmenorrhea    ED Discharge Orders        Ordered    naproxen (NAPROSYN) 500 MG tablet  2 times daily     09/05/17 2245    ondansetron (ZOFRAN ODT) 4 MG disintegrating tablet  Every 8 hours PRN     09/05/17 2245       Rolland PorterJames, Calandria Mullings, MD 09/06/17 0000

## 2017-09-05 NOTE — ED Notes (Signed)
ED Provider at bedside. 

## 2017-09-05 NOTE — ED Triage Notes (Signed)
C/o abd pain , vaginal d/c and bleeding-LMP Sept-states she had neg home preg test-NAD-steady gait

## 2017-09-07 ENCOUNTER — Other Ambulatory Visit (FREE_STANDING_LABORATORY_FACILITY): Payer: Self-pay | Admitting: Obstetrics & Gynecology

## 2017-09-07 ENCOUNTER — Encounter (FREE_STANDING_LABORATORY_FACILITY): Admit: 2017-09-07 | Discharge: 2017-09-07 | Disposition: A | Discharge status: Home / Self Care | Payer: Self-pay

## 2017-09-08 LAB — FETAL FIBRONECTIN: FETAL FIBRONECTIN: NEGATIVE

## 2017-09-13 ENCOUNTER — Telehealth: Payer: Self-pay | Admitting: General Practice

## 2017-09-13 NOTE — Telephone Encounter (Signed)
Patient called and left message stating [facility]. Patient states she has had weird things going on lately with her reproductive system and would like to be seen. Called patient and she states she feels like no one is helping her or wants to see her. Patient states every place she calls they tell her they cannot help her. Patient states she stopped sprintec birth control pills in July and has been having periods since then until her missed period on October 25th. Patient states she had spotting on November 8 for 2 days but that is it. Patient states since November 3 she has had uncomfortable cramping that feels like pressure in her lower abdomen as well as back pain. Patient states something isn't right and this isn't normal for her. Patient states she is concerned something is seriously wrong with her. Told patient we can see her in our office but I cannot make appts but will let our front office staff know she wants an appt and someone will contact her to set that up. Patient became frustrated saying no one wants to help her out or see her for this problem she is having. Told patient that is not what I am saying. Explained to patient that I am a nurse and do not have the capability to make anyone an appt in our office because our front office staff makes appts. Told patient I am going to let them know she needs an appt and they will contact her to schedule something. Patient verbalized understanding and states something weird is just going on in her body because back in June she started having itching all over for 2 months and no one could tell her why. Patient states she doesn't know if there's something wrong with her gallbladder or something else but is feeling very upset and concerned and cannot sleep at night due to all of this. Told patient we will certainly see her for the missed periods but also recommended PCP for her other symptoms. Provided  the numbers to [facility]. Patient verbalized understanding & will await phone call from front office with an appt. Patient seemed very anxious on the phone and might benefit from seeing Asher MuirJamie when she comes in for an appt.

## 2017-09-14 ENCOUNTER — Emergency Department (HOSPITAL_BASED_OUTPATIENT_CLINIC_OR_DEPARTMENT_OTHER)
Admission: EM | Admit: 2017-09-14 | Discharge: 2017-09-14 | Disposition: A | Discharge status: Home / Self Care | Payer: Self-pay | Attending: Physician Assistant | Admitting: Physician Assistant

## 2017-09-14 ENCOUNTER — Encounter (HOSPITAL_BASED_OUTPATIENT_CLINIC_OR_DEPARTMENT_OTHER): Payer: Self-pay

## 2017-09-14 ENCOUNTER — Emergency Department (HOSPITAL_BASED_OUTPATIENT_CLINIC_OR_DEPARTMENT_OTHER): Payer: Self-pay

## 2017-09-14 ENCOUNTER — Other Ambulatory Visit: Payer: Self-pay

## 2017-09-14 DIAGNOSIS — K5900 Constipation, unspecified: Secondary | ICD-10-CM | POA: Insufficient documentation

## 2017-09-14 DIAGNOSIS — R109 Unspecified abdominal pain: Secondary | ICD-10-CM

## 2017-09-14 DIAGNOSIS — R1011 Right upper quadrant pain: Secondary | ICD-10-CM | POA: Insufficient documentation

## 2017-09-14 LAB — COMPREHENSIVE METABOLIC PANEL
ALT: 21 U/L (ref 14–54)
AST: 23 U/L (ref 15–41)
Albumin: 4.4 g/dL (ref 3.5–5.0)
Alkaline Phosphatase: 79 U/L (ref 38–126)
Anion gap: 6 (ref 5–15)
BUN: 12 mg/dL (ref 6–20)
CO2: 27 mmol/L (ref 22–32)
Calcium: 9 mg/dL (ref 8.9–10.3)
Chloride: 105 mmol/L (ref 101–111)
Creatinine, Ser: 0.61 mg/dL (ref 0.44–1.00)
GFR calc Af Amer: >60 mL/min (ref >60)
GFR calc non Af Amer: >60 mL/min (ref >60)
Glucose, Bld: 105 mg/dL — ABNORMAL HIGH (ref 65–99)
Potassium: 3.5 mmol/L (ref 3.5–5.1)
Sodium: 138 mmol/L (ref 135–145)
Total Bilirubin: 0.4 mg/dL (ref 0.3–1.2)
Total Protein: 7.3 g/dL (ref 6.5–8.1)

## 2017-09-14 LAB — CBC WITH DIFFERENTIAL/PLATELET
Basophils Absolute: 0 10*3/uL (ref 0.0–0.1)
Basophils Relative: 0 %
Eosinophils Absolute: 0.1 10*3/uL (ref 0.0–0.7)
Eosinophils Relative: 1 %
HCT: 41.7 % (ref 36.0–46.0)
Hemoglobin: 13.8 g/dL (ref 12.0–15.0)
Lymphocytes Relative: 32 %
Lymphs Abs: 3.2 10*3/uL (ref 0.7–4.0)
MCH: 28.9 pg (ref 26.0–34.0)
MCHC: 33.1 g/dL (ref 30.0–36.0)
MCV: 87.4 fL (ref 78.0–100.0)
Monocytes Absolute: 1 10*3/uL (ref 0.1–1.0)
Monocytes Relative: 9 %
Neutro Abs: 5.9 10*3/uL (ref 1.7–7.7)
Neutrophils Relative %: 58 %
Platelets: 301 10*3/uL (ref 150–400)
RBC: 4.77 MIL/uL (ref 3.87–5.11)
RDW: 13 % (ref 11.5–15.5)
WBC: 10.2 10*3/uL (ref 4.0–10.5)

## 2017-09-14 LAB — URINALYSIS, ROUTINE W REFLEX MICROSCOPIC
Bilirubin Urine: NEGATIVE
Glucose, UA: NEGATIVE mg/dL
Hgb urine dipstick: NEGATIVE
Ketones, ur: NEGATIVE mg/dL
Leukocytes, UA: NEGATIVE
Nitrite: NEGATIVE
Protein, ur: NEGATIVE mg/dL
Specific Gravity, Urine: >1.03 — ABNORMAL HIGH (ref 1.005–1.030)
pH: 6 (ref 5.0–8.0)

## 2017-09-14 LAB — PREGNANCY, URINE: Preg Test, Ur: NEGATIVE

## 2017-09-14 LAB — LIPASE, BLOOD: Lipase: 26 U/L (ref 11–51)

## 2017-09-14 MED ORDER — OMEPRAZOLE 20 MG PO CPDR: 20.0000 mg | DELAYED_RELEASE_CAPSULE | Freq: Every day | ORAL | 0 refills | Status: DC

## 2017-09-14 MED ORDER — IOPAMIDOL (ISOVUE-300) INJECTION 61%
100.0000 mL | Freq: Once | INTRAVENOUS | Status: AC | PRN
  Administered 2017-09-14: 100 mL via INTRAVENOUS

## 2017-09-14 NOTE — ED Notes (Signed)
ED Provider at bedside. 

## 2017-09-14 NOTE — ED Notes (Signed)
Patient transported to CT 

## 2017-09-14 NOTE — ED Triage Notes (Signed)
Pt c/o abd pain, back pain, constipation, dysuria-grimacing

## 2017-09-14 NOTE — Discharge Instructions (Signed)
Your labs and CT were completely normal.  Please follow-up with your primary care physician.

## 2017-09-14 NOTE — ED Notes (Signed)
Pt and SO given d/c instructions as per chart. Rx x 1. Verbalizes understanding. No questions. 

## 2017-09-14 NOTE — ED Provider Notes (Signed)
[facility] EMERGENCY DEPARTMENT Provider Note   CSN: [id number] Arrival date & time: 09/14/17  1924     History   Chief Complaint Chief Complaint  Patient presents with  . Abdominal Pain    HPI Tammy Howard is a 26 y.o. female.  HPI  Is a 26 year old female presenting with abdominal pain, back pain, multiple complaints.  Patient was seen recently for the same.  Patient and her husband think they have "cryptic pregnancy".  She believes she is having pregnancy symptoms but has had multiple negative pregnancy test.  Patient also says that she is having constipation, however says that she had a loose stool 3 days ago.  Patient says she occasionally has spasms in the right upper quadrant, occasionally in her epigastric area.  Patient thinks she has "hard things" in her abdomen.  Patient says she has been nauseated "for months".  Has been vomiting "for maybe one whole week".    Past Medical History:  Diagnosis Date  . Migraine     There are no active problems to display for this patient.   Past Surgical History:  Procedure Laterality Date  . EYE SURGERY      OB History    No data available       Home Medications    Prior to Admission medications   Medication Sig Start Date End Date Taking? Authorizing Provider  naproxen (NAPROSYN) 500 MG tablet Take 1 tablet (500 mg total) 2 (two) times daily by mouth. 09/05/17   Rolland PorterJames, Mark, MD  ondansetron (ZOFRAN ODT) 4 MG disintegrating tablet Take 1 tablet (4 mg total) every 8 (eight) hours as needed by mouth for nausea. 09/05/17   Rolland PorterJames, Mark, MD    Family History No family history on file.  Social History Social History   Tobacco Use  . Smoking status: Never Smoker  . Smokeless tobacco: Never Used  Substance Use Topics  . Alcohol use: Yes    Comment: rare  . Drug use: Yes    Types: Marijuana     Allergies   Bee venom   Review of Systems Review of Systems  Constitutional: Positive for fatigue.  Negative for activity change.  Respiratory: Negative for shortness of breath.   Cardiovascular: Negative for chest pain.  Gastrointestinal: Positive for abdominal pain, constipation, diarrhea, nausea and vomiting.  Genitourinary: Positive for dysuria, urgency and vaginal bleeding.     Physical Exam Updated Vital Signs BP 137/90 (BP Location: Left Arm)   Pulse 86   Temp 98.1 F (36.7 C) (Oral)   Resp (!) 21   Ht 5\' 5"  (1.651 m)   Wt 121.6 kg (268 lb 1.3 oz)   LMP 08/31/2017   SpO2 100%   BMI 44.61 kg/m   Physical Exam  Constitutional: She is oriented to person, place, and time. She appears well-developed and well-nourished.  HENT:  Head: Normocephalic and atraumatic.  Eyes: Right eye exhibits no discharge.  Cardiovascular: Normal rate.  Pulmonary/Chest: Effort normal.  Abdominal: Normal appearance. She exhibits no distension and no ascites. There is no tenderness.  Patient grimacing with even light touch her stomach, diffuse.  Nonfocal.  Neurological: She is oriented to person, place, and time.  Skin: Skin is warm and dry. She is not diaphoretic.  Psychiatric: She has a normal mood and affect.  Nursing note and vitals reviewed.    ED Treatments / Results  Labs (all labs ordered are listed, but only abnormal results are displayed) Labs Reviewed  URINALYSIS, ROUTINE  W REFLEX MICROSCOPIC - Abnormal; Notable for the following components:      Result Value   Specific Gravity, Urine >1.030 (*)    All other components within normal limits  URINE CULTURE  PREGNANCY, URINE  CBC WITH DIFFERENTIAL/PLATELET  COMPREHENSIVE METABOLIC PANEL  LIPASE, BLOOD    EKG  EKG Interpretation None       Radiology No results found.  Procedures Procedures (including critical care time)  Medications Ordered in ED Medications - No data to display   Initial Impression / Assessment and Plan / ED Course  I have reviewed the triage vital signs and the nursing notes.  Pertinent  labs & imaging results that were available during my care of the patient were reviewed by me and considered in my medical decision making (see chart for details).     Is a 26 year old female presenting with abdominal pain, back pain, multiple complaints.  Patient was seen recently for the same.  Patient and her husband think they have "cryptic pregnancy".  She believes she is having pregnancy symptoms but has had multiple negative pregnancy test.  Patient also says that she is having constipation, however says that she had a loose stool 3 days ago.  Patient says she occasionally has spasms in the right upper quadrant, occasionally in her epigastric area.  Patient thinks she has "hard things" in her abdomen.  Patient says she has been nauseated "for months".  Has been vomiting "for maybe one whole week".    8:20 PM Given this is her second visit, we will do labs, CT abdomen given her diffuse pain.  However ultimately think workable likely be negative.  I will give her reassurance about the cryptic pregnancy and had discussion with her about anxiety and how it can cause atypical symptoms.  Patient has been understanding.  10:39 PM Normal exam, normal vitals, normal CT. Will treat for gastritis.   Final Clinical Impressions(s) / ED Diagnoses   Final diagnoses:  None    ED Discharge Orders    None       Abelino DerrickMackuen, Juandaniel Manfredo Lyn, MD 09/14/17 2245

## 2017-09-16 LAB — URINE CULTURE

## 2017-10-18 ENCOUNTER — Emergency Department (HOSPITAL_BASED_OUTPATIENT_CLINIC_OR_DEPARTMENT_OTHER): Payer: Self-pay

## 2017-10-18 ENCOUNTER — Emergency Department (HOSPITAL_BASED_OUTPATIENT_CLINIC_OR_DEPARTMENT_OTHER)
Admission: EM | Admit: 2017-10-18 | Discharge: 2017-10-18 | Disposition: A | Discharge status: Home / Self Care | Payer: Self-pay | Attending: Emergency Medicine | Admitting: Emergency Medicine

## 2017-10-18 ENCOUNTER — Other Ambulatory Visit: Payer: Self-pay

## 2017-10-18 ENCOUNTER — Encounter (HOSPITAL_BASED_OUTPATIENT_CLINIC_OR_DEPARTMENT_OTHER): Payer: Self-pay

## 2017-10-18 DIAGNOSIS — R55 Syncope and collapse: Secondary | ICD-10-CM | POA: Insufficient documentation

## 2017-10-18 DIAGNOSIS — F418 Other specified anxiety disorders: Secondary | ICD-10-CM | POA: Insufficient documentation

## 2017-10-18 DIAGNOSIS — R51 Headache: Secondary | ICD-10-CM | POA: Insufficient documentation

## 2017-10-18 DIAGNOSIS — Z79899 Other long term (current) drug therapy: Secondary | ICD-10-CM | POA: Insufficient documentation

## 2017-10-18 DIAGNOSIS — R519 Headache, unspecified: Secondary | ICD-10-CM

## 2017-10-18 LAB — URINALYSIS, ROUTINE W REFLEX MICROSCOPIC
Bilirubin Urine: NEGATIVE
Glucose, UA: NEGATIVE mg/dL
Hgb urine dipstick: NEGATIVE
Ketones, ur: 15 mg/dL — AB
Leukocytes, UA: NEGATIVE
Nitrite: NEGATIVE
Protein, ur: NEGATIVE mg/dL
Specific Gravity, Urine: >1.03 — ABNORMAL HIGH (ref 1.005–1.030)
pH: 6 (ref 5.0–8.0)

## 2017-10-18 LAB — COMPREHENSIVE METABOLIC PANEL
ALT: 31 U/L (ref 14–54)
AST: 26 U/L (ref 15–41)
Albumin: 4.5 g/dL (ref 3.5–5.0)
Alkaline Phosphatase: 87 U/L (ref 38–126)
Anion gap: 9 (ref 5–15)
BUN: 10 mg/dL (ref 6–20)
CO2: 25 mmol/L (ref 22–32)
Calcium: 9.2 mg/dL (ref 8.9–10.3)
Chloride: 105 mmol/L (ref 101–111)
Creatinine, Ser: 0.66 mg/dL (ref 0.44–1.00)
GFR calc Af Amer: >60 mL/min (ref >60)
GFR calc non Af Amer: >60 mL/min (ref >60)
Glucose, Bld: 85 mg/dL (ref 65–99)
Potassium: 3.4 mmol/L — ABNORMAL LOW (ref 3.5–5.1)
Sodium: 139 mmol/L (ref 135–145)
Total Bilirubin: 0.6 mg/dL (ref 0.3–1.2)
Total Protein: 7.8 g/dL (ref 6.5–8.1)

## 2017-10-18 LAB — CBC WITH DIFFERENTIAL/PLATELET
Basophils Absolute: 0 10*3/uL (ref 0.0–0.1)
Basophils Relative: 0 %
Eosinophils Absolute: 0.1 10*3/uL (ref 0.0–0.7)
Eosinophils Relative: 1 %
HCT: 44.1 % (ref 36.0–46.0)
Hemoglobin: 14.7 g/dL (ref 12.0–15.0)
Lymphocytes Relative: 36 %
Lymphs Abs: 3.1 10*3/uL (ref 0.7–4.0)
MCH: 28.9 pg (ref 26.0–34.0)
MCHC: 33.3 g/dL (ref 30.0–36.0)
MCV: 86.8 fL (ref 78.0–100.0)
Monocytes Absolute: 0.6 10*3/uL (ref 0.1–1.0)
Monocytes Relative: 7 %
Neutro Abs: 4.8 10*3/uL (ref 1.7–7.7)
Neutrophils Relative %: 56 %
Platelets: 288 10*3/uL (ref 150–400)
RBC: 5.08 MIL/uL (ref 3.87–5.11)
RDW: 13 % (ref 11.5–15.5)
WBC: 8.6 10*3/uL (ref 4.0–10.5)

## 2017-10-18 LAB — PREGNANCY, URINE: Preg Test, Ur: NEGATIVE

## 2017-10-18 MED ORDER — TIZANIDINE HCL 4 MG PO TABS: 4.0000 mg | ORAL_TABLET | Freq: Four times a day (QID) | ORAL | 0 refills | Status: DC | PRN

## 2017-10-18 MED ORDER — HYDROXYZINE HCL 25 MG PO TABS
50.0000 mg | ORAL_TABLET | Freq: Once | ORAL | Status: AC
  Administered 2017-10-18: 50 mg via ORAL
  Filled 2017-10-18: qty 2

## 2017-10-18 MED ORDER — HYDROXYZINE HCL 25 MG PO TABS: 50.0000 mg | ORAL_TABLET | Freq: Three times a day (TID) | ORAL | 0 refills | Status: DC | PRN

## 2017-10-18 NOTE — ED Triage Notes (Signed)
Pt states she passed out x 2 since thanksgiving-[facility]-now c/o HA, dizziness, slurred speech and blurred vision x 1 week-NAD-steady gait

## 2017-10-18 NOTE — ED Notes (Signed)
ED Provider at bedside discussing test results and dispo plan of care. 

## 2017-10-18 NOTE — ED Notes (Signed)
Patient transported to CT 

## 2017-10-18 NOTE — ED Provider Notes (Signed)
[facility] EMERGENCY DEPARTMENT Provider Note   CSN: [id number] Arrival date & time: 10/18/17  1615     History   Chief Complaint Chief Complaint  Patient presents with  . Loss of Consciousness    HPI Tammy Howard is a 26 y.o. female.  HPI Patient reports he had multiple symptoms for several months.  She is getting very worried and anxious that there is something wrong that will kill her.  She reports that she had been seen in the emergency department for abdominal pain back in November.  At that time she was having quite a bit of lower abdominal pain.  She reports that however has ultimately improved significantly and is not active right now.  She reports that she has been getting headaches.  They have been posterior and at her temples.  No fevers.  She reports sometimes she feels like a sinus headache because she gets dizzy as well.  She reports she has had to passing out episodes over the past 2 months.  One happened after she sneezed and then got up and started walking outside.  She felt her peripheral vision getting dark, and knew she was close to passing out.  She is also being evaluated for anxiety.  She has seen a counselor and has a follow-up appointment.  She has not been started on any medications at this point. Past Medical History:  Diagnosis Date  . Migraine     There are no active problems to display for this patient.   Past Surgical History:  Procedure Laterality Date  . EYE SURGERY      OB History    No data available       Home Medications    Prior to Admission medications   Medication Sig Start Date End Date Taking? Authorizing Provider  hydrOXYzine (ATARAX/VISTARIL) 25 MG tablet Take 2 tablets (50 mg total) by mouth every 8 (eight) hours as needed. 10/18/17   Arby BarrettePfeiffer, Zaina Jenkin, MD  omeprazole (PRILOSEC) 20 MG capsule Take 1 capsule (20 mg total) by mouth daily. 09/14/17   Mackuen, Courteney Lyn, MD  tiZANidine (ZANAFLEX) 4 MG tablet Take 1  tablet (4 mg total) by mouth every 6 (six) hours as needed for muscle spasms. 10/18/17   Arby BarrettePfeiffer, Maisy Newport, MD    Family History No family history on file.  Social History Social History   Tobacco Use  . Smoking status: Never Smoker  . Smokeless tobacco: Never Used  Substance Use Topics  . Alcohol use: Yes    Comment: occ  . Drug use: Yes    Types: Marijuana     Allergies   Bee venom   Review of Systems Review of Systems 10 Systems reviewed and are negative for acute change except as noted in the HPI.   Physical Exam Updated Vital Signs BP 125/74 (BP Location: Right Arm)   Pulse 85   Temp 98.1 F (36.7 C) (Oral)   Resp 18   Ht 5\' 5"  (1.651 m)   Wt 114.3 kg (252 lb)   LMP 07/20/2017   SpO2 100%   BMI 41.93 kg/m   Physical Exam  Constitutional: She is oriented to person, place, and time. She appears well-developed and well-nourished. No distress.  HENT:  Head: Normocephalic and atraumatic.  Right Ear: External ear normal.  Left Ear: External ear normal.  Nose: Nose normal.  Mouth/Throat: Oropharynx is clear and moist.  Lateral TMs normal.  Posterior oropharynx normal.  Eyes: Conjunctivae and EOM are normal.  Pupils are equal, round, and reactive to light.  Normal consensual pupillary responses  Neck: Neck supple.  Neck is supple without lymphadenopathy, mass or fullness.  Patient does however have very reproducible musculoskeletal pain along the paracervical and trapezius muscle bodies  Cardiovascular: Normal rate, regular rhythm, normal heart sounds and intact distal pulses.  No murmur heard. Pulmonary/Chest: Effort normal and breath sounds normal. No respiratory distress.  Abdominal: Soft. She exhibits no distension. There is no tenderness. There is guarding.  Musculoskeletal: Normal range of motion. She exhibits no edema, tenderness or deformity.  Neurological: She is alert and oriented to person, place, and time. No cranial nerve deficit. She exhibits  normal muscle tone. Coordination normal.  Finger-nose exam.  Normal heel-to-shin exam.  Normal cognitive function.  Skin: Skin is warm and dry.  Psychiatric: She has a normal mood and affect.  Nursing note and vitals reviewed.    ED Treatments / Results  Labs (all labs ordered are listed, but only abnormal results are displayed) Labs Reviewed  URINALYSIS, ROUTINE W REFLEX MICROSCOPIC - Abnormal; Notable for the following components:      Result Value   Specific Gravity, Urine >1.030 (*)    Ketones, ur 15 (*)    All other components within normal limits  COMPREHENSIVE METABOLIC PANEL - Abnormal; Notable for the following components:   Potassium 3.4 (*)    All other components within normal limits  PREGNANCY, URINE  CBC WITH DIFFERENTIAL/PLATELET    EKG  EKG Interpretation None       Radiology Ct Head Wo Contrast  Result Date: 10/18/2017 CLINICAL DATA:  26 year old female with headache. EXAM: CT HEAD WITHOUT CONTRAST TECHNIQUE: Contiguous axial images were obtained from the base of the skull through the vertex without intravenous contrast. COMPARISON:  None. FINDINGS: Brain: No evidence of acute infarction, hemorrhage, hydrocephalus, extra-axial collection or mass lesion/mass effect. Vascular: No hyperdense vessel or unexpected calcification. Skull: Normal. Negative for fracture or focal lesion. Sinuses/Orbits: No acute finding. Other: None IMPRESSION: Normal noncontrast CT of the brain. Electronically Signed   By: Elgie CollardArash  Radparvar M.D.   On: 10/18/2017 20:27    Procedures Procedures (including critical care time)  Medications Ordered in ED Medications  hydrOXYzine (ATARAX/VISTARIL) tablet 50 mg (50 mg Oral Given 10/18/17 2017)     Initial Impression / Assessment and Plan / ED Course  I have reviewed the triage vital signs and the nursing notes.  Pertinent labs & imaging results that were available during my care of the patient were reviewed by me and considered in my  medical decision making (see chart for details).     Final Clinical Impressions(s) / ED Diagnoses   Final diagnoses:  Syncope, unspecified syncope type  Nonintractable episodic headache, unspecified headache type  Anxiety about health  Patient has multiple symptoms.  She is most concerned at this time about headaches she has been having.  These have been posterior and she perceives associated dizziness.  Does have migraine he perceives the headache frequently to be in the back of her head as though her hair had been pulled too tight or too long.  Patient is also been dealing with a lot of anxiety recently.  She is seeking counseling.  She has not been started on any treatment yet.  She does express a lot of worry and anxiety about her health, she reports she has not felt well for several months now with variable symptoms and nothing can be specifically identified.  This is very worrisome for  her.  At this time, patient's physical and neurologic exams are normal.  CT head shows no acute findings and basic diagnostic studies are within normal limits.  Because the patient does continue to have variable symptoms I have advised she needs follow-up.  Ideally she would follow-up with a PCP and neurology for possibly complex migraines.  He is counseled to use the referral number in her discharge instructions for assistance.  Return precautions are reviewed.  ED Discharge Orders        Ordered    hydrOXYzine (ATARAX/VISTARIL) 25 MG tablet  Every 8 hours PRN     10/18/17 2111    tiZANidine (ZANAFLEX) 4 MG tablet  Every 6 hours PRN     10/18/17 2111       Arby Barrette, MD 10/18/17 2125

## 2017-10-22 ENCOUNTER — Encounter (HOSPITAL_COMMUNITY): Payer: Self-pay | Admitting: Emergency Medicine

## 2017-10-22 ENCOUNTER — Emergency Department (HOSPITAL_COMMUNITY)
Admission: EM | Admit: 2017-10-22 | Discharge: 2017-10-22 | Disposition: A | Discharge status: Home / Self Care | Payer: Medicaid Other | Attending: Physician Assistant | Admitting: Physician Assistant

## 2017-10-22 ENCOUNTER — Other Ambulatory Visit: Payer: Self-pay

## 2017-10-22 DIAGNOSIS — Z79899 Other long term (current) drug therapy: Secondary | ICD-10-CM | POA: Insufficient documentation

## 2017-10-22 DIAGNOSIS — F1210 Cannabis abuse, uncomplicated: Secondary | ICD-10-CM | POA: Insufficient documentation

## 2017-10-22 DIAGNOSIS — R200 Anesthesia of skin: Secondary | ICD-10-CM | POA: Insufficient documentation

## 2017-10-22 DIAGNOSIS — F418 Other specified anxiety disorders: Secondary | ICD-10-CM

## 2017-10-22 DIAGNOSIS — R040 Epistaxis: Secondary | ICD-10-CM | POA: Insufficient documentation

## 2017-10-22 DIAGNOSIS — R110 Nausea: Secondary | ICD-10-CM | POA: Insufficient documentation

## 2017-10-22 MED ORDER — LORAZEPAM 1 MG PO TABS: 1.0000 mg | ORAL_TABLET | Freq: Three times a day (TID) | ORAL | 0 refills | Status: DC | PRN

## 2017-10-22 NOTE — ED Triage Notes (Signed)
Pt. Stated, Tammy Howard had a headache since before christmas and yesterday I had a nosebleed. [facility] But its not helping the medication that was given.

## 2017-10-22 NOTE — ED Notes (Signed)
Patient able to ambulate independently  

## 2017-10-22 NOTE — ED Provider Notes (Signed)
[facility] Provider Note   CSN: [id number] Arrival date & time: 10/22/17  [id number]     History   Chief Complaint Chief Complaint  Patient presents with  . Headache  . Epistaxis    HPI Darlen RoundMary E Atienza is a 26 y.o. female.  HPI   Patient is a 26 year old female presenting with multiple somatic complaints.  Patient had multiple ER visits.  They have been escalating the last several weeks.  Patient recognizes that she is feeling anxious.  She thinks anxiety is driving a lot of her symptoms but she "cannot shake the idea that something is really wrong".  She states "I know nothing is wrong and people are telling me his anxiety but I am just so worried".  Patient has symptoms such as headaches, occasional nightmares, nosebleeds, occasional tongue tingling, occasional numbness, occasional headaches, occasional nausea.  Patient's been seen in the emergency department multiple times as well as [facility].  Patient is in therapy but does not have another appointment until next Wednesday.  Patient denies active SI or HI.  Patient's impetus for coming today is because she woke up with nightmares and had a little bit of bleeding from her nose.    Past Medical History:  Diagnosis Date  . Migraine     There are no active problems to display for this patient.   Past Surgical History:  Procedure Laterality Date  . EYE SURGERY      OB History    No data available       Home Medications    Prior to Admission medications   Medication Sig Start Date End Date Taking? Authorizing Provider  hydrOXYzine (ATARAX/VISTARIL) 25 MG tablet Take 2 tablets (50 mg total) by mouth every 8 (eight) hours as needed. 10/18/17   Arby BarrettePfeiffer, Marcy, MD  omeprazole (PRILOSEC) 20 MG capsule Take 1 capsule (20 mg total) by mouth daily. 09/14/17   Darshana Curnutt Lyn, MD  tiZANidine (ZANAFLEX) 4 MG tablet Take 1 tablet (4 mg total) by mouth every 6  (six) hours as needed for muscle spasms. 10/18/17   Arby BarrettePfeiffer, Marcy, MD    Family History No family history on file.  Social History Social History   Tobacco Use  . Smoking status: Never Smoker  . Smokeless tobacco: Never Used  Substance Use Topics  . Alcohol use: Yes    Comment: occ  . Drug use: Yes    Types: Marijuana     Allergies   Bee venom   Review of Systems Review of Systems  Constitutional: Positive for activity change and fatigue.  HENT: Positive for congestion.   Eyes: Negative for discharge.  Respiratory: Negative for cough.   Cardiovascular: Positive for chest pain.  Gastrointestinal: Negative for abdominal distention.  Musculoskeletal: Positive for joint swelling.  Skin: Negative for rash.  Neurological: Positive for headaches.  Psychiatric/Behavioral: Positive for behavioral problems. Negative for agitation and self-injury. The patient is nervous/anxious.   All other systems reviewed and are negative.    Physical Exam Updated Vital Signs BP 135/89 (BP Location: Right Arm)   Pulse 79   Temp 98 F (36.7 C) (Oral)   Resp 18   Ht 5' 5.5" (1.664 m)   Wt 114.3 kg (252 lb)   LMP 08/31/2017   SpO2 98%   BMI 41.30 kg/m   Physical Exam  Constitutional: She appears well-developed and well-nourished. No distress.  HENT:  Head: Normocephalic and atraumatic.  Eyes: Conjunctivae are normal.  Neck: Neck supple.  Cardiovascular: Normal rate and regular rhythm.  No murmur heard. Pulmonary/Chest: Effort normal and breath sounds normal. No respiratory distress.  Abdominal: Soft. There is no tenderness.  Musculoskeletal: She exhibits no edema.  Neurological: She is alert.  Skin: Skin is warm and dry.  Psychiatric: She has a normal mood and affect.  Nursing note and vitals reviewed.    ED Treatments / Results  Labs (all labs ordered are listed, but only abnormal results are displayed) Labs Reviewed - No data to display  EKG  EKG  Interpretation None       Radiology No results found.  Procedures Procedures (including critical care time)  Medications Ordered in ED Medications - No data to display   Initial Impression / Assessment and Plan / ED Course  I have reviewed the triage vital signs and the nursing notes.  Pertinent labs & imaging results that were available during my care of the patient were reviewed by me and considered in my medical decision making (see chart for details).     I think the root of patient's symptoms are likely anxiety.  Patient already has referrals for specialist.  We will have case management talk to her about maximizing her mental health options.  We will give her antianxiety to help her make it through her appointment on Wednesday.  Final Clinical Impressions(s) / ED Diagnoses   Final diagnoses:  None    ED Discharge Orders    None       Abelino DerrickMackuen, Evonte Prestage Lyn, MD 10/22/17 2014

## 2017-10-22 NOTE — Discharge Instructions (Signed)
Please take this medication help with your anxiety.  We want you to follow-up with mental health as soon as possible.  If you are feeling like you want to hurt yourself or others please return immediately to the emergency department.

## 2017-10-22 NOTE — Care Management (Signed)
ED CM consulted concerning patient having had 6 ED visits in the past 6 months.  CM reviewed record no PCP or health insurance listed. CM met with patient at bedside she confirmed information. Patient reports being from [place] area. CM discussed [place] resources [facility] at [facility] accepting new uninsured patient, she is agreeable with attempting to establish care there, contact information provided and placed on patient AVS. Updated Dr. Thomasene Lot on transitional care plan she is in agreement. No further ED CM needs identified.

## 2017-10-25 ENCOUNTER — Ambulatory Visit: Payer: Self-pay | Admitting: Obstetrics and Gynecology

## 2017-10-25 ENCOUNTER — Encounter: Payer: Self-pay | Admitting: Obstetrics and Gynecology

## 2017-10-25 ENCOUNTER — Ambulatory Visit (INDEPENDENT_AMBULATORY_CARE_PROVIDER_SITE_OTHER): Payer: Self-pay | Admitting: Clinical

## 2017-10-25 ENCOUNTER — Other Ambulatory Visit: Payer: Self-pay

## 2017-10-25 VITALS — BP 125/75 | HR 95 | Ht 67.5 in | Wt 260.3 lb

## 2017-10-25 DIAGNOSIS — F419 Anxiety disorder, unspecified: Secondary | ICD-10-CM

## 2017-10-25 DIAGNOSIS — N912 Amenorrhea, unspecified: Secondary | ICD-10-CM | POA: Insufficient documentation

## 2017-10-25 DIAGNOSIS — N979 Female infertility, unspecified: Secondary | ICD-10-CM

## 2017-10-25 DIAGNOSIS — Z6841 Body Mass Index (BMI) 40.0 and over, adult: Secondary | ICD-10-CM | POA: Insufficient documentation

## 2017-10-25 DIAGNOSIS — L680 Hirsutism: Secondary | ICD-10-CM

## 2017-10-25 DIAGNOSIS — G43109 Migraine with aura, not intractable, without status migrainosus: Secondary | ICD-10-CM | POA: Insufficient documentation

## 2017-10-25 MED ORDER — MEDROXYPROGESTERONE ACETATE 10 MG PO TABS
ORAL_TABLET | ORAL | 3 refills | Status: DC
Start: 2017-10-25 — End: 2019-07-10

## 2017-10-25 MED ORDER — FOLIC ACID 1 MG PO TABS: 1.0000 mg | ORAL_TABLET | Freq: Every day | ORAL | 6 refills | Status: DC

## 2017-10-25 NOTE — BH Specialist Note (Signed)
Integrated Behavioral Health Initial Visit  MRN: [id number] Name: Tammy RoundMary E Scovill  Number of Integrated Behavioral Health Clinician visits:: 1/6 Session Start time: 3:10  Session End time: 3:40 Total time: 30 minutes  Type of Service: Integrated Behavioral Health- Individual/Family Interpretor:No. Interpretor Name and Language: n/a   Warm Hand Off Completed.       SUBJECTIVE: Tammy Howard is a 27 y.o. female accompanied by n/a Patient was referred by Dr Vergie LivingPickens for depression and anxiety. Patient reports the following symptoms/concerns: Pt states her primary concern is excessive worry over black mold growing in her home affecting her health, including panic attacks and inadequate sleep.  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 Reduce symptoms of: anxiety, depression and stress 2. Increase knowledge and/or ability of: stress reduction  3. Demonstrate ability to: Increase healthy adjustment to current life circumstances  INTERVENTIONS: Interventions utilized: Mindfulness or Management consultantelaxation Training, Psychoeducation and/or Health Education and Link to [employer] Resources  Standardized Assessments completed: GAD-7 and PHQ 9  ASSESSMENT: Patient currently experiencing Anxiety disorder, unspecified.   Patient may benefit from psychoeducation and brief therapeutic intervention regarding coping with symptoms of anxiety.  PLAN: 1. Follow up with behavioral health clinician on : As needed 2. Behavioral recommendations:   -Consider attending [facility], to establish care with psychiatry  -CALM relaxation breathing exercise every morning and at bedtime; continue as long as remains helpful -Begin using sleep app tonight, at bedtime -Read educational material regarding coping with symptoms of anxiety with panic attacks and depression -Consider [email or web address] for links to additional resources that may be able to advise on housing issues 3. Referral(s): Integrated Art gallery managerBehavioral Health Services (In Clinic), Community Mental Health Services (LME/Outside Clinic) and [employer] Resources:  Housing 4. "From scale of 1-10, how likely are you to follow plan?": 9  Valetta CloseJamie C Josealberto Montalto, LCSW  Depression screen [facility] 2/9 10/25/2017  Decreased Interest 2  Down, Depressed, Hopeless 2  PHQ - 2 Score 4  Altered sleeping 3  Tired, decreased energy 2  Change in appetite 1  Feeling bad or failure about yourself  1  Trouble concentrating 2  Moving slowly or fidgety/restless 2  Suicidal thoughts 0  PHQ-9 Score 15   GAD 7 : Generalized Anxiety Score 10/25/2017  Nervous, Anxious, on Edge 3  Control/stop worrying 2  Worry too much - different things 2  Trouble relaxing 2  Restless 2  Easily annoyed or irritable 2  Afraid - awful might happen 3  Total GAD 7 Score 16

## 2017-10-25 NOTE — Progress Notes (Signed)
Obstetrics and Gynecology New Patient Evaluation  Appointment Date: 10/25/2017  OBGYN Clinic: Center for [facility]  Primary Care Provider: Patient, No Pcp Per  Referring Provider: Self  Chief Complaint:  Chief Complaint  Patient presents with  . Menstrual Problem  Fertility questions  History of Present Illness: Tammy Howard is a 27 y.o. Caucasian G0 (Patient's last menstrual period was 08/31/2017 (exact date).), seen for the above chief complaint.   Patient states she's been trying to get pregnant since stopping her Sprintec in July/August, which she had been on for the past 3-4 years to help regulate her cycles; she had episodes of oligomenorrhea prior to the OCPs. She had a normal 5day period in September but none in October and in [place] November she had brown/pink discharge for two days.   She did go to the ED on 11/22 and had a negative CT scan.   No current VB, pain. +facial hair that she states she removes  She had been getting her OCPs from the [facility] in [place].   Review of Systems: as noted in the History of Present Illness.   Past Medical History:  Past Medical History:  Diagnosis Date  . Migraine with aura     Past Surgical History:  Past Surgical History:  Procedure Laterality Date  . EYE SURGERY      Past Obstetrical History:  OB History  Gravida Para Term Preterm AB Living  0 0 0 0 0 0  SAB TAB Ectopic Multiple Live Births  0 0 0 0 0        Past Gynecological History: As per HPI. History of Pap Smear(s): negative at the [facility] in the past two years History of STI(s): No. She is currently using none for contraception.   Social History:  Social History   Socioeconomic History  . Marital status: Married    Spouse name: Not on file  . Number of children: Not on file  . Years of education: Not on file  . Highest education level: Not on file  Social Needs  . Financial resource strain: Not on file  . Food insecurity -  worry: Not on file  . Food insecurity - inability: Not on file  . Transportation needs - medical: Not on file  . Transportation needs - non-medical: Not on file  Occupational History  . Not on file  Tobacco Use  . Smoking status: Never Smoker  . Smokeless tobacco: Never Used  Substance and Sexual Activity  . Alcohol use: Yes    Comment: occ  . Drug use: Yes    Types: Marijuana  . Sexual activity: Yes    Birth control/protection: None  Other Topics Concern  . Not on file  Social History Narrative  . Not on file    Family History: History reviewed. No pertinent family history.   Medications Sanaa E. Ambs had no medications administered during this visit. Current Outpatient Medications  Medication Sig Dispense Refill  . hydrOXYzine (ATARAX/VISTARIL) 25 MG tablet Take 2 tablets (50 mg total) by mouth every 8 (eight) hours as needed. 30 tablet 0  . omeprazole (PRILOSEC) 20 MG capsule Take 1 capsule (20 mg total) by mouth daily. 30 capsule 0  . tiZANidine (ZANAFLEX) 4 MG tablet Take 1 tablet (4 mg total) by mouth every 6 (six) hours as needed for muscle spasms. 30 tablet 0   No current facility-administered medications for this visit.     Allergies Bee venom   Physical Exam:  BP 125/75   Pulse 95   Ht 5' 7.5" (1.715 m)   Wt 260 lb 4.8 oz (118.1 kg)   LMP 08/31/2017 (Exact Date)   BMI 40.17 kg/m  Body mass index is 40.17 kg/m. General appearance: Well nourished, well developed female in no acute distress.  Neck:  Supple, normal appearance, and no thyromegaly  Cardiovascular: normal s1 and s2.  No murmurs, rubs or gallops. Respiratory:  Clear to auscultation bilateral. Normal respiratory effort Abdomen: positive bowel sounds and no masses, hernias; diffusely non tender to palpation, non distended Neuro/Psych:  Normal mood and affect.  Skin:  Warm and dry.  Lymphatic:  No inguinal lymphadenopathy.   Pelvic exam: is not limited by body habitus EGBUS: within normal  limits, Vagina: within normal limits and with no blood or discharge in the vault, Cervix: normal appearing cervix without tenderness, discharge or lesions. Uterus:  nonenlarged and non tender and Adnexa:  normal adnexa and no mass, fullness, tenderness Rectovaginal: deferred  Laboratory: UPT negative  Radiology: none  Assessment: pt stable  Plan:  *AUB: d/w her that leading ddx is pcos. She doesn't have insurance currently but is applying for assistance. In the interim, I told her I recommend cyclic provera to give her a withdrawal period qmonth and decrease endometrial hyperplasia/cancer risk which she is amenable to. Pt to call us when she gets assistance and can do full lab w/u and u/s *Fertility: see above. D/w her that if she just has regular PCOS, can d/w her re: OI with clomid but recommend healthy diet, exercise and weight loss in the interim, which can also help with PCOS. Also, recommend SA for her husband in the interim.   RTC 3 months  [place] Bingharlie Asenath Balash, Montez HagemanJr MD Attending [facility](Faculty Practice)

## 2017-11-23 ENCOUNTER — Other Ambulatory Visit (FREE_STANDING_LABORATORY_FACILITY): Payer: Self-pay

## 2017-11-23 ENCOUNTER — Encounter (FREE_STANDING_LABORATORY_FACILITY): Admit: 2017-11-23 | Discharge: 2017-11-23 | Disposition: A | Discharge status: Home / Self Care | Payer: Self-pay

## 2017-11-24 LAB — BUPRENORPHINE (SUBOXONE), RANDOM URINE: BUPRENORPHINE URINE: NEGATIVE

## 2018-01-01 ENCOUNTER — Encounter (HOSPITAL_COMMUNITY): Payer: Self-pay

## 2018-01-01 ENCOUNTER — Other Ambulatory Visit: Payer: Self-pay

## 2018-01-01 ENCOUNTER — Emergency Department (HOSPITAL_COMMUNITY)
Admission: EM | Admit: 2018-01-01 | Discharge: 2018-01-01 | Disposition: A | Discharge status: Home / Self Care | Payer: Medicaid Other | Attending: Emergency Medicine | Admitting: Emergency Medicine

## 2018-01-01 DIAGNOSIS — Z79899 Other long term (current) drug therapy: Secondary | ICD-10-CM | POA: Insufficient documentation

## 2018-01-01 DIAGNOSIS — J029 Acute pharyngitis, unspecified: Secondary | ICD-10-CM | POA: Insufficient documentation

## 2018-01-01 DIAGNOSIS — R070 Pain in throat: Secondary | ICD-10-CM

## 2018-01-01 LAB — RAPID STREP SCREEN (MED CTR MEBANE ONLY): Streptococcus, Group A Screen (Direct): NEGATIVE

## 2018-01-01 NOTE — Discharge Instructions (Signed)
Continue salt water gargles and chloroseptic throat spray. Follow up with ear nose throat.

## 2018-01-01 NOTE — ED Provider Notes (Signed)
[facility] Provider Note   CSN: [id number] Arrival date & time: 01/01/18  1600     History   Chief Complaint Chief Complaint  Patient presents with  . Sore Throat    HPI Tammy Howard is a 27 y.o. female.  HPI  Tammy Howard is a 27 y.o. female presents to emergency department with complaint of sore throat.  Patient states she was eating fish 1 week ago when she felt like a bone scratched her throat.  She was seen at several emergency department since then.  She has had negative x-ray, [facility], he reports that the pain is still there, mainly on left side, worse with swallowing.  She states that she is able to swallow but it is painful.  Denies any fever or chills.  No congestion.  No cough.  She has been using Chloraseptic throat spray with no relief of her symptoms.   Past Medical History:  Diagnosis Date  . Migraine with aura     Patient Active Problem List   Diagnosis Date Noted  . BMI 40.0-44.9, adult (HCC) 10/25/2017  . Amenorrhea 10/25/2017  . Hirsutism 10/25/2017  . Migraine with aura     Past Surgical History:  Procedure Laterality Date  . EYE SURGERY      OB History    Gravida Para Term Preterm AB Living   0 0 0 0 0 0   SAB TAB Ectopic Multiple Live Births   0 0 0 0 0       Home Medications    Prior to Admission medications   Medication Sig Start Date End Date Taking? Authorizing Provider  folic acid (FOLVITE) 1 MG tablet Take 1 tablet (1 mg total) by mouth daily. 10/25/17   Amana BingPickens, Charlie, MD  hydrOXYzine (ATARAX/VISTARIL) 25 MG tablet Take 2 tablets (50 mg total) by mouth every 8 (eight) hours as needed. 10/18/17   Arby BarrettePfeiffer, Marcy, MD  LORazepam (ATIVAN) 1 MG tablet Take 1 tablet (1 mg total) by mouth every 8 (eight) hours as needed for up to 7 doses for anxiety. 10/22/17   Mackuen, Courteney Lyn, MD  medroxyPROGESTERone (PROVERA) 10 MG tablet One tab po qday x 14d per month.  Expect a period a few days after stopping the pills 10/25/17   Herrin BingPickens, Charlie, MD  omeprazole (PRILOSEC) 20 MG capsule Take 1 capsule (20 mg total) by mouth daily. 09/14/17   Mackuen, Courteney Lyn, MD  tiZANidine (ZANAFLEX) 4 MG tablet Take 1 tablet (4 mg total) by mouth every 6 (six) hours as needed for muscle spasms. 10/18/17   Arby BarrettePfeiffer, Marcy, MD    Family History History reviewed. No pertinent family history.  Social History Social History   Tobacco Use  . Smoking status: Never Smoker  . Smokeless tobacco: Never Used  Substance Use Topics  . Alcohol use: Yes    Comment: occ  . Drug use: Yes    Types: Marijuana     Allergies   Bee venom   Review of Systems Review of Systems  Constitutional: Negative for chills and fever.  HENT: Positive for sore throat. Negative for congestion, dental problem, ear pain and postnasal drip.   Neurological: Negative for headaches.  All other systems reviewed and are negative.    Physical Exam Updated Vital Signs BP 125/85 (BP Location: Right Arm)   Pulse 82   Temp 98.2 F (36.8 C) (Oral)   Resp 18   SpO2  100%   Physical Exam  Constitutional: She appears well-developed and well-nourished. No distress.  HENT:  Oropharynx is normal, no enlarged tonsils, no erythema, no exudate, uvula midline.  No lymphadenopathy.  No findings.  Swallowing own secretions normally  Eyes: Conjunctivae are normal.  Neck: Neck supple.  Neurological: She is alert.  Skin: Skin is warm and dry.  Nursing note and vitals reviewed.    ED Treatments / Results  Labs (all labs ordered are listed, but only abnormal results are displayed) Labs Reviewed  RAPID STREP SCREEN (NOT AT [facility])  CULTURE, GROUP A STREP [facility])    EKG  EKG Interpretation None       Radiology No results found.  Procedures Procedures (including critical care time)  Medications Ordered in ED Medications - No data to display   Initial Impression / Assessment and Plan /  ED Course  I have reviewed the triage vital signs and the nursing notes.  Pertinent labs & imaging results that were available during my care of the patient were reviewed by me and considered in my medical decision making (see chart for details).     Patient with persistent sore throat after possibly injuring her throat with a fishbone.  Had negative scope and negative x-rays.  Has been to multiple ERs for the same.  I guess it is possible that the bone is still stuck in the throat, however I am unable to visualize it.  Oropharynx appears to be completely normal.  There is no airway compromise.  She is swallowing with no difficulty.  We discussed that as the next step should be for her to follow-up with ear nose throat doctor to have a better exam.  Patient agreed.  Rapid strep negative here.  Vitals:   01/01/18 1623  BP: 125/85  Pulse: 82  Resp: 18  Temp: 98.2 F (36.8 C)  TempSrc: Oral  SpO2: 100%     Final Clinical Impressions(s) / ED Diagnoses   Final diagnoses:  Throat pain    ED Discharge Orders    None       Jaynie Crumble, Cordelia Poche 01/01/18 1759    Azalia Bilis, MD 01/01/18 2300

## 2018-01-01 NOTE — ED Provider Notes (Signed)
Patient placed in Quick Look pathway, seen and evaluated   Chief Complaint: sore throat  HPI: Patient presents to emergency department with complaint of sore throat.  About a week ago she states she was eating fish and felt like a fishbone was stuck in her throat.  [facility] and was scoped at bedside by a surgeon, with no fishbone visualized.  She had a negative x-ray.  She states she continues to have pain to her throat.  It looks like [facility] but left without being seen.  Denies any fever or chills.  No nausea or vomiting.  ROS: Positive for sore throat and difficulty swallowing.  Negative for fever, chills, nausea, vomiting, cough, congestion.  Physical Exam:   Gen: No distress  Neuro: Awake and Alert  Skin: Warm    Focused Exam: No respiratory distress.  Lungs are clear bilaterally.  Patient is able to swallow secretions.  Oropharynx is normal, tonsils are normal, uvula midline.  No exudate.  No tonsillar stones.  No foreign bodies.  Pt with persistent sore throat after eating fish a week ago. [facility] with no findings. Rapid strep ordered here. No exam findings. Afebrile. Will reassess after rapid strep back.    Vitals:   01/01/18 1623  BP: 125/85  Pulse: 82  Resp: 18  Temp: 98.2 F (36.8 C)  TempSrc: Oral  SpO2: 100%    Initiation of care has begun. The patient has been counseled on the process, plan, and necessity for staying for the completion/evaluation, and the remainder of the medical screening examination    Jaynie CrumbleKirichenko, Jakaree Pickard, Cordelia Poche-C 01/01/18 Alinda [id number]    Campos, Kevin, MD 01/01/18 2302

## 2018-01-01 NOTE — ED Triage Notes (Signed)
Pt states she was seen Sunday because she thought she had a fish bone stuck in her throat. Pt states the pain has not gotten any better and she now has trouble eating and talking. Clear speech in triage. Maintaining secretions.

## 2018-01-02 ENCOUNTER — Other Ambulatory Visit: Payer: Self-pay | Admitting: Otolaryngology

## 2018-01-02 DIAGNOSIS — R198 Other specified symptoms and signs involving the digestive system and abdomen: Secondary | ICD-10-CM

## 2018-01-02 DIAGNOSIS — R0989 Other specified symptoms and signs involving the circulatory and respiratory systems: Secondary | ICD-10-CM

## 2018-01-04 ENCOUNTER — Ambulatory Visit
Admission: RE | Admit: 2018-01-04 | Discharge: 2018-01-04 | Disposition: A | Discharge status: Home / Self Care | Payer: No Typology Code available for payment source | Source: Ambulatory Visit | Attending: Otolaryngology | Admitting: Otolaryngology

## 2018-01-04 DIAGNOSIS — R0989 Other specified symptoms and signs involving the circulatory and respiratory systems: Secondary | ICD-10-CM

## 2018-01-04 DIAGNOSIS — R198 Other specified symptoms and signs involving the digestive system and abdomen: Secondary | ICD-10-CM

## 2018-01-04 LAB — CULTURE, GROUP A STREP (THRC)

## 2018-01-05 ENCOUNTER — Other Ambulatory Visit: Payer: Self-pay | Admitting: Otolaryngology

## 2018-01-05 DIAGNOSIS — E041 Nontoxic single thyroid nodule: Secondary | ICD-10-CM

## 2018-01-11 ENCOUNTER — Ambulatory Visit
Admission: RE | Admit: 2018-01-11 | Discharge: 2018-01-11 | Disposition: A | Discharge status: Home / Self Care | Payer: Self-pay | Source: Ambulatory Visit | Attending: Otolaryngology | Admitting: Otolaryngology

## 2018-01-11 DIAGNOSIS — E041 Nontoxic single thyroid nodule: Secondary | ICD-10-CM

## 2018-01-17 ENCOUNTER — Other Ambulatory Visit: Payer: Self-pay | Admitting: Otolaryngology

## 2018-01-17 DIAGNOSIS — E041 Nontoxic single thyroid nodule: Secondary | ICD-10-CM

## 2018-02-08 ENCOUNTER — Other Ambulatory Visit (HOSPITAL_COMMUNITY)
Admission: RE | Admit: 2018-02-08 | Discharge: 2018-02-08 | Disposition: A | Discharge status: Home / Self Care | Payer: Medicaid Other | Source: Ambulatory Visit | Attending: Radiology | Admitting: Radiology

## 2018-02-08 ENCOUNTER — Ambulatory Visit
Admission: RE | Admit: 2018-02-08 | Discharge: 2018-02-08 | Disposition: A | Discharge status: Home / Self Care | Payer: Medicaid Other | Source: Ambulatory Visit | Attending: Otolaryngology | Admitting: Otolaryngology

## 2018-02-08 DIAGNOSIS — E041 Nontoxic single thyroid nodule: Secondary | ICD-10-CM | POA: Insufficient documentation

## 2019-01-14 IMAGING — US US FNA BIOPSY THYROID 1ST LESION
1 series · 13 of 16 positions shown · non-contrast
Comparison: US Thyroid 01/11/2018

MEDICATIONS:
10 cc 1% lidocaine

COMPLICATIONS:
None immediate.

INDICATION: Indeterminate thyroid nodule

Left inferior thyroid nodule
1.9 cm
EXAM:
ULTRASOUND GUIDED FINE NEEDLE ASPIRATION OF INDETERMINATE THYROID
NODULE
TECHNIQUE: Informed written consent was obtained from the patient after a
discussion of the risks, benefits and alternatives to treatment.
Questions regarding the procedure were encouraged and answered. A
timeout was performed prior to the initiation of the procedure.

[Series 1: us fna biopsy thyroid 1st lesion · 0.06mm/px · 16 acquisitions, 13 frames shown]
[im 1/16]
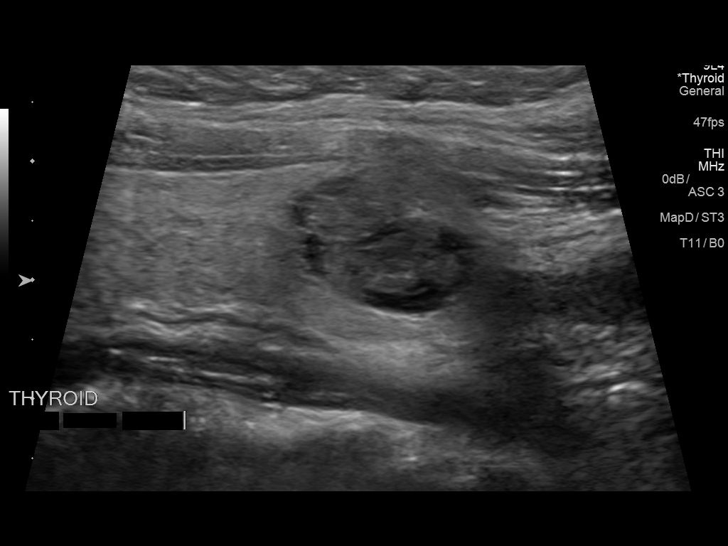
[im 2/16]
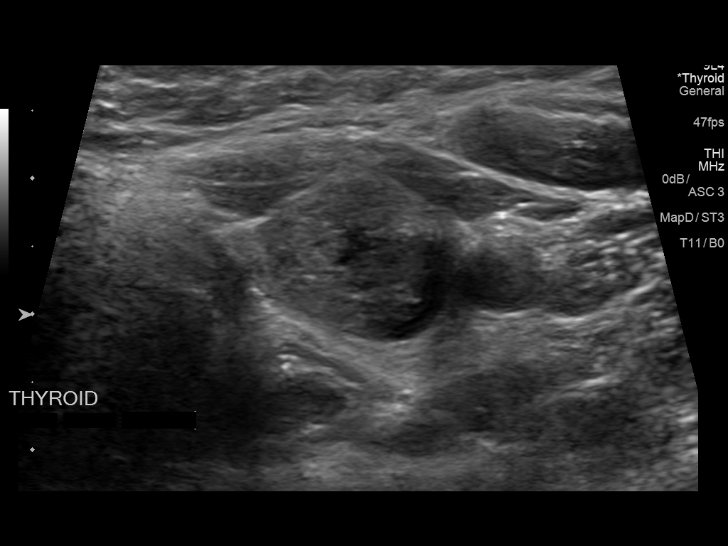
[im 4/16]
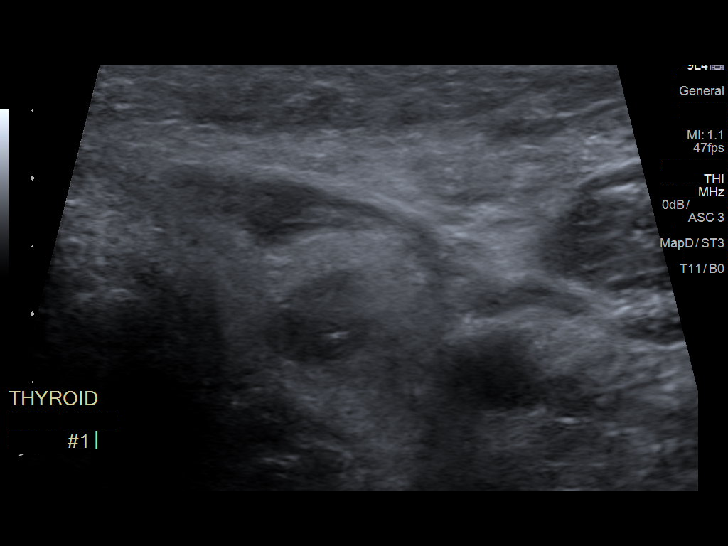
[im 5/16]
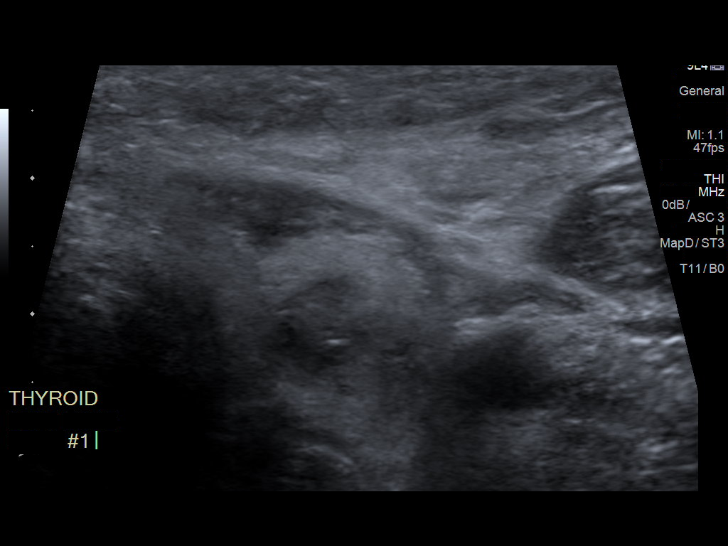
[im 6/16]
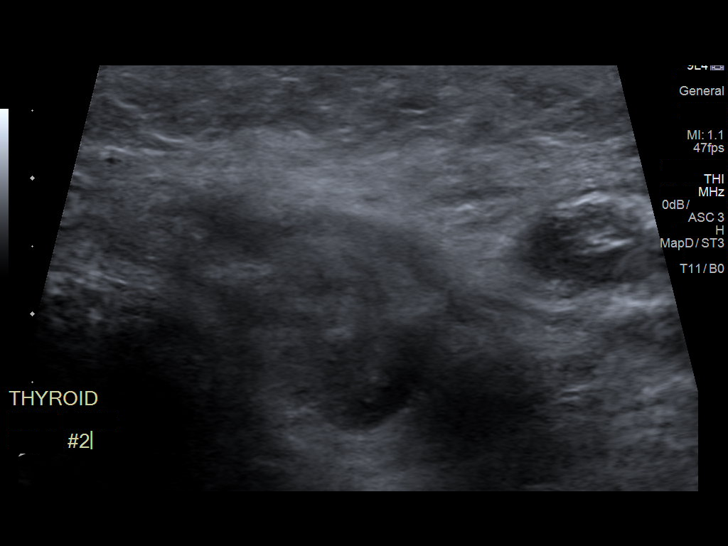
[im 7/16]
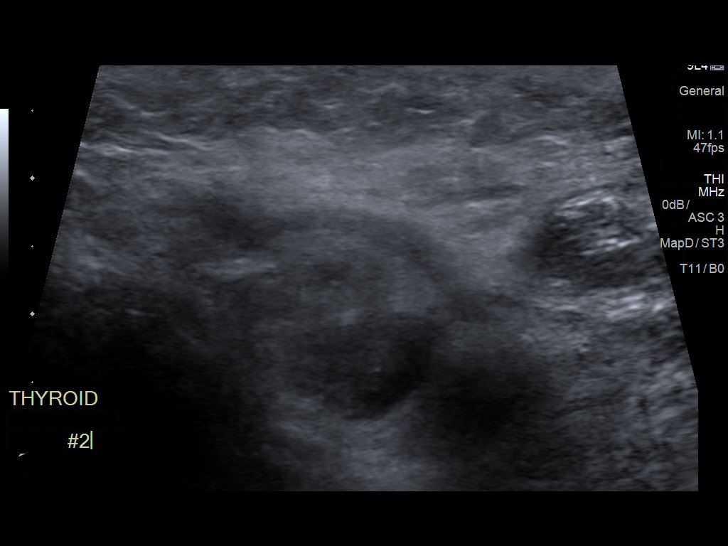
[im 9/16]
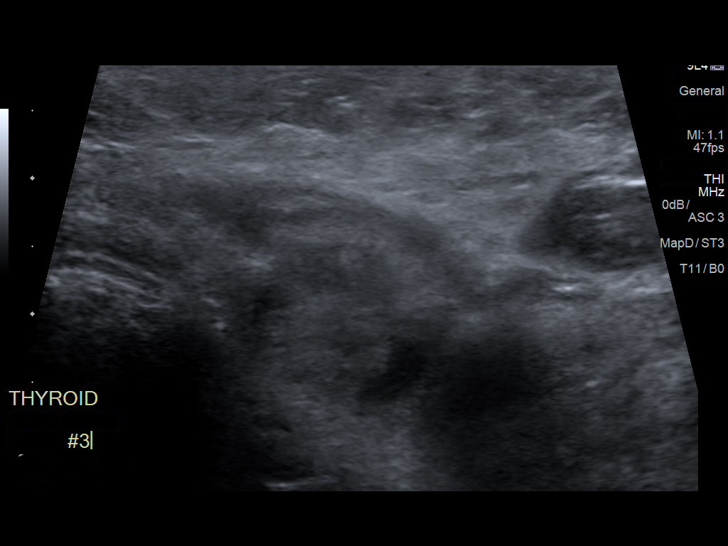
[im 10/16]
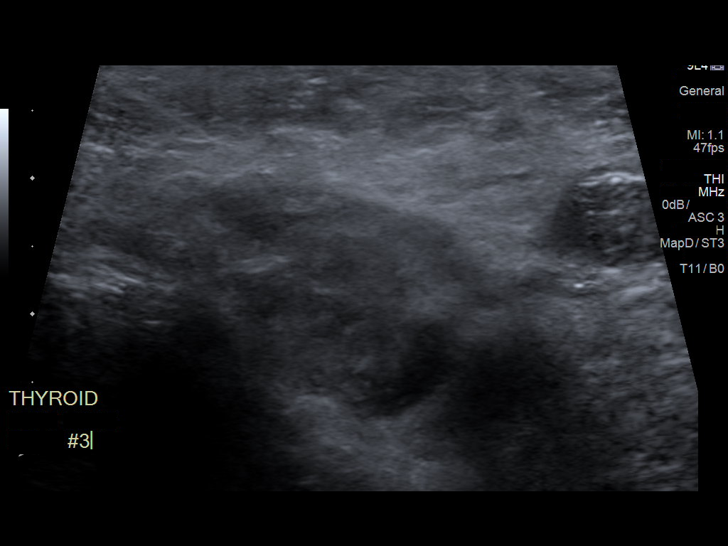
[im 11/16]
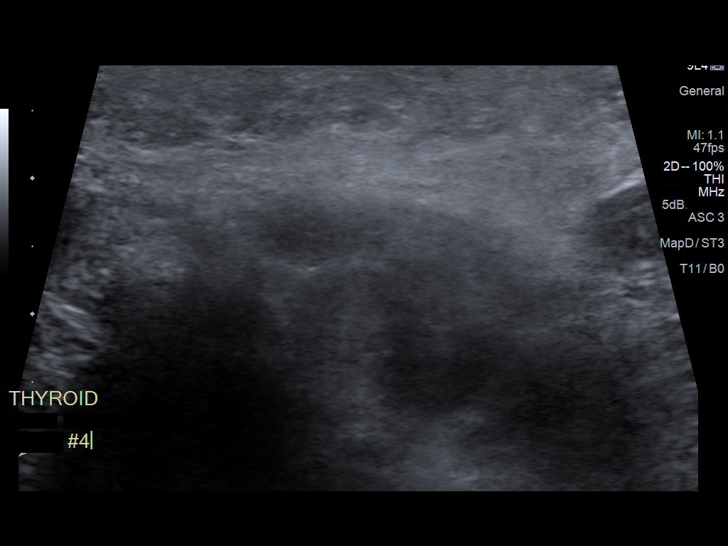
[im 12/16]
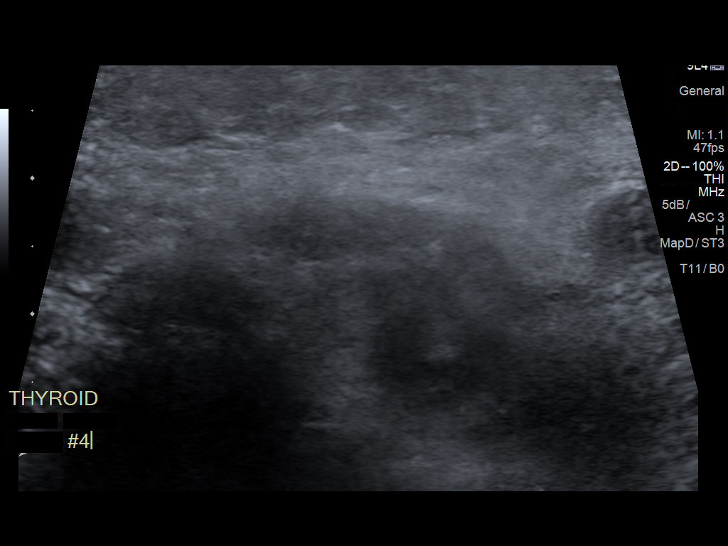
[im 13/16]
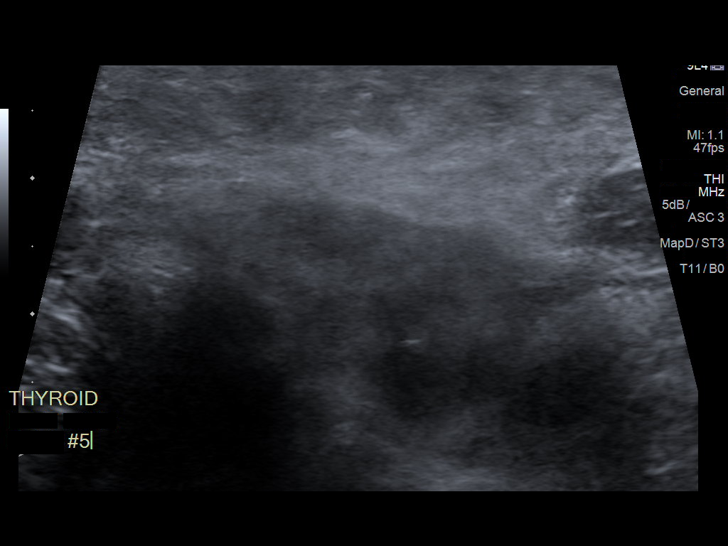
[im 15/16]
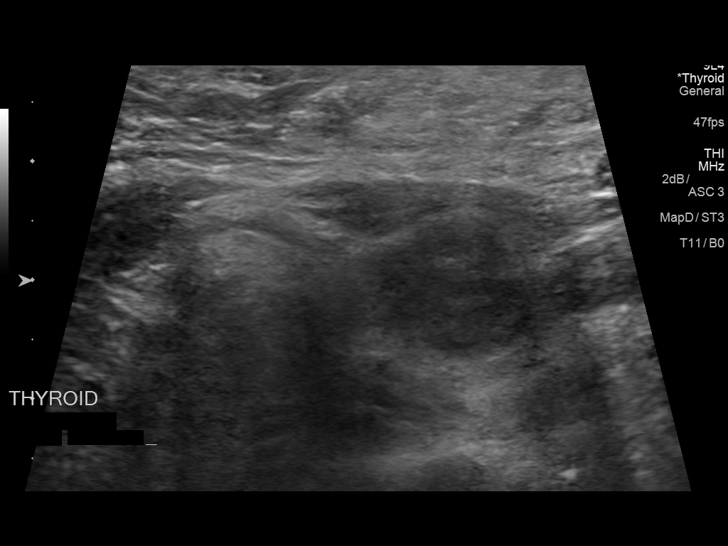
[im 16/16]
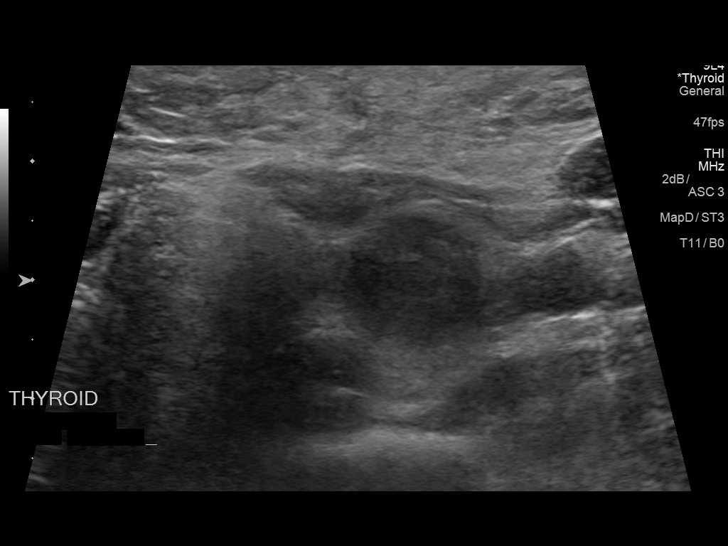

[13 of 16 positions shown; findings below may reference images not displayed]

Pre-procedural ultrasound scanning demonstrated unchanged size and
appearance of the indeterminate nodule within the left thyroid

The procedure was planned. The neck was prepped in the usual sterile
fashion, and a sterile drape was applied covering the operative
field. A timeout was performed prior to the initiation of the
procedure. Local anesthesia was provided with 1% lidocaine.

Under direct ultrasound guidance, 5 FNA biopsies were performed of
the left inferior thyroid nodule with a 25 gauge needle. 2 of these
samples were obtained for AFIRMA per ordering TAK WO.

Multiple ultrasound images were saved for procedural documentation
purposes. The samples were prepared and submitted to pathology.

Limited post procedural scanning was negative for hematoma or
additional complication. Dressings were placed. The patient
tolerated the above procedures procedure well without immediate
postprocedural complication.
FINDINGS: Nodule reference number based on prior diagnostic ultrasound: 4

Maximum size: 1.9 cm

Location: Left; Inferior

ACR TI-RADS risk category: 1.9 cm

Reason for biopsy: meets ACR TI-RADS criteria

Ultrasound imaging confirms appropriate placement of the needles
within the thyroid nodule.
IMPRESSION: Technically successful ultrasound guided fine needle aspiration of
left inferior thyroid nodule

Read by

Shimae Apass

## 2019-07-10 ENCOUNTER — Inpatient Hospital Stay (HOSPITAL_COMMUNITY)
Admission: AD | Admit: 2019-07-10 | Discharge: 2019-07-10 | Disposition: A | Discharge status: Home / Self Care | Payer: BLUE CROSS/BLUE SHIELD | Attending: Obstetrics and Gynecology | Admitting: Obstetrics and Gynecology

## 2019-07-10 ENCOUNTER — Inpatient Hospital Stay (HOSPITAL_COMMUNITY): Payer: BLUE CROSS/BLUE SHIELD

## 2019-07-10 ENCOUNTER — Encounter (HOSPITAL_COMMUNITY): Payer: Self-pay | Admitting: *Deleted

## 2019-07-10 ENCOUNTER — Other Ambulatory Visit: Payer: Self-pay | Admitting: Women's Health

## 2019-07-10 ENCOUNTER — Other Ambulatory Visit: Payer: Self-pay

## 2019-07-10 DIAGNOSIS — Z0184 Encounter for antibody response examination: Secondary | ICD-10-CM | POA: Insufficient documentation

## 2019-07-10 DIAGNOSIS — Z3A01 Less than 8 weeks gestation of pregnancy: Secondary | ICD-10-CM | POA: Diagnosis not present

## 2019-07-10 DIAGNOSIS — R102 Pelvic and perineal pain: Secondary | ICD-10-CM | POA: Insufficient documentation

## 2019-07-10 DIAGNOSIS — Z793 Long term (current) use of hormonal contraceptives: Secondary | ICD-10-CM | POA: Insufficient documentation

## 2019-07-10 DIAGNOSIS — Z79899 Other long term (current) drug therapy: Secondary | ICD-10-CM | POA: Insufficient documentation

## 2019-07-10 DIAGNOSIS — O26891 Other specified pregnancy related conditions, first trimester: Secondary | ICD-10-CM | POA: Diagnosis not present

## 2019-07-10 DIAGNOSIS — Z3490 Encounter for supervision of normal pregnancy, unspecified, unspecified trimester: Secondary | ICD-10-CM

## 2019-07-10 DIAGNOSIS — O26899 Other specified pregnancy related conditions, unspecified trimester: Secondary | ICD-10-CM

## 2019-07-10 HISTORY — DX: Ventral hernia without obstruction or gangrene: K43.9

## 2019-07-10 LAB — POCT PREGNANCY, URINE: Preg Test, Ur: POSITIVE — AB

## 2019-07-10 LAB — URINALYSIS, ROUTINE W REFLEX MICROSCOPIC
Bilirubin Urine: NEGATIVE
Glucose, UA: NEGATIVE mg/dL
Hgb urine dipstick: NEGATIVE
Ketones, ur: 20 mg/dL — AB
Nitrite: NEGATIVE
Protein, ur: NEGATIVE mg/dL
Specific Gravity, Urine: 1.028 (ref 1.005–1.030)
pH: 5 (ref 5.0–8.0)

## 2019-07-10 LAB — CBC
HCT: 41.8 % (ref 36.0–46.0)
Hemoglobin: 13.8 g/dL (ref 12.0–15.0)
MCH: 29.4 pg (ref 26.0–34.0)
MCHC: 33 g/dL (ref 30.0–36.0)
MCV: 88.9 fL (ref 80.0–100.0)
Platelets: 301 10*3/uL (ref 150–400)
RBC: 4.7 MIL/uL (ref 3.87–5.11)
RDW: 13 % (ref 11.5–15.5)
WBC: 8.6 10*3/uL (ref 4.0–10.5)
nRBC: 0 % (ref 0.0–0.2)

## 2019-07-10 LAB — ABO/RH
ABO/RH(D): A NEG
Antibody Screen: NEGATIVE

## 2019-07-10 LAB — WET PREP, GENITAL
Clue Cells Wet Prep HPF POC: NONE SEEN
Sperm: NONE SEEN
Trich, Wet Prep: NONE SEEN
Yeast Wet Prep HPF POC: NONE SEEN

## 2019-07-10 LAB — HCG, QUANTITATIVE, PREGNANCY: hCG, Beta Chain, Quant, S: 39776 m[IU]/mL — ABNORMAL HIGH (ref <5)

## 2019-07-10 NOTE — MAU Note (Signed)
.   Tammy Howard is a 28 y.o. at [redacted]w[redacted]d here in MAU reporting: pain in her left lower abdomen since earlier today, Positive home pregnancy test last week Denies any vaginal bleeding LMP: 05/09/19 Onset of complaint: earlier today Pain score: 5 Vitals:   07/10/19 1305  BP: (!) 152/75  Pulse: 72  Resp: 16  Temp: 98.1 F (36.7 C)  SpO2: 100%     FHT: Lab orders placed from triage: UA/UPT

## 2019-07-10 NOTE — MAU Provider Note (Addendum)
History     CSN: [id number]  Arrival date and time: 07/10/19 1240   First Provider Initiated Contact with Patient 07/10/19 1330      Chief Complaint  Patient presents with  . Possible Pregnancy  . Abdominal Pain   Ms. Tammy Howard is a 28 y.o. G1P0000 at [redacted]w[redacted]d who presents to MAU for left-sided pelvic pain.  Onset: this morning Location: left-sided pelvis Duration: <24hrs Character: dull, aching currently, sharp and stabbing at first Aggravating/Associated: none/none Relieving: none Treatment: ice - "helped some" Severity: 1/10  Pt denies VB, vaginal discharge/odor/itching. Pt denies N/V, abdominal pain, constipation, diarrhea, or urinary problems. Pt denies fever, chills, fatigue, sweating or changes in appetite. Pt denies SOB or chest pain. Pt denies dizziness, HA, light-headedness, weakness.  Problems this pregnancy include: pt has not yet been seen. Allergies? NKDA Current medications/supplements? PNVs Prenatal care provider? Will give list of OB providers  Pt's husband present for entire visit.   OB History    Gravida  1   Para  0   Term  0   Preterm  0   AB  0   Living  0     SAB  0   TAB  0   Ectopic  0   Multiple  0   Live Births  0           Past Medical History:  Diagnosis Date  . Hernia, epigastric   . Migraine with aura     Past Surgical History:  Procedure Laterality Date  . EYE SURGERY      History reviewed. No pertinent family history.  Social History   Tobacco Use  . Smoking status: Never Smoker  . Smokeless tobacco: Never Used  Substance Use Topics  . Alcohol use: Not Currently    Comment: occ  . Drug use: Not Currently    Types: Marijuana    Allergies:  Allergies  Allergen Reactions  . Bee Venom Swelling    Medications Prior to Admission  Medication Sig Dispense Refill Last Dose  . Prenatal Vit-Fe Fumarate-FA (PRENATAL MULTIVITAMIN) TABS tablet Take 1 tablet by mouth daily at 12 noon.     . folic  acid (FOLVITE) 1 MG tablet Take 1 tablet (1 mg total) by mouth daily. 60 tablet 6   . hydrOXYzine (ATARAX/VISTARIL) 25 MG tablet Take 2 tablets (50 mg total) by mouth every 8 (eight) hours as needed. 30 tablet 0   . LORazepam (ATIVAN) 1 MG tablet Take 1 tablet (1 mg total) by mouth every 8 (eight) hours as needed for up to 7 doses for anxiety. 7 tablet 0   . medroxyPROGESTERone (PROVERA) 10 MG tablet One tab po qday x 14d per month. Expect a period a few days after stopping the pills 42 tablet 3   . omeprazole (PRILOSEC) 20 MG capsule Take 1 capsule (20 mg total) by mouth daily. 30 capsule 0   . tiZANidine (ZANAFLEX) 4 MG tablet Take 1 tablet (4 mg total) by mouth every 6 (six) hours as needed for muscle spasms. 30 tablet 0     Review of Systems  Constitutional: Negative for chills, diaphoresis, fatigue and fever.  Respiratory: Negative for shortness of breath.   Cardiovascular: Negative for chest pain.  Gastrointestinal: Negative for abdominal pain, constipation, diarrhea, nausea and vomiting.  Genitourinary: Positive for pelvic pain. Negative for dysuria, flank pain, frequency, urgency, vaginal bleeding and vaginal discharge.  Neurological: Negative for dizziness, weakness, light-headedness and headaches.   Physical Exam  Blood pressure (!) 152/75, pulse 72, temperature 98.1 F (36.7 C), resp. rate 16, height 5\' 5"  (1.651 m), weight 113.4 kg, last menstrual period 05/09/2019, SpO2 100 %.  Patient Vitals for the past 24 hrs:  BP Temp Pulse Resp SpO2 Height Weight  07/10/19 1305 (!) 152/75 98.1 F (36.7 C) 72 16 100 % 5\' 5"  (1.651 m) 113.4 kg   Physical Exam  Constitutional: She is oriented to person, place, and time. She appears well-developed and well-nourished. No distress.  HENT:  Head: Normocephalic and atraumatic.  Respiratory: Effort normal.  GI: Soft. She exhibits no distension and no mass. There is no abdominal tenderness. There is no rebound and no guarding.  Genitourinary:  There is no rash, tenderness or lesion on the right labia. There is no rash, tenderness or lesion on the left labia. Uterus is not enlarged and not tender. Cervix exhibits no motion tenderness, no discharge and no friability. Right adnexum displays no mass, no tenderness and no fullness. Left adnexum displays no mass, no tenderness and no fullness.    No vaginal discharge, tenderness or bleeding.  No tenderness or bleeding in the vagina.  Neurological: She is alert and oriented to person, place, and time.  Skin: Skin is warm and dry. She is not diaphoretic.  Psychiatric: She has a normal mood and affect. Her behavior is normal. Judgment and thought content normal.   Results for orders placed or performed during the hospital encounter of 07/10/19 (from the past 24 hour(s))  Pregnancy, urine POC     Status: Abnormal   Collection Time: 07/10/19  1:03 PM  Result Value Ref Range   Preg Test, Ur POSITIVE (A) NEGATIVE  Urinalysis, Routine w reflex microscopic     Status: Abnormal   Collection Time: 07/10/19  1:04 PM  Result Value Ref Range   Color, Urine YELLOW YELLOW   APPearance HAZY (A) CLEAR   Specific Gravity, Urine 1.028 1.005 - 1.030   pH 5.0 5.0 - 8.0   Glucose, UA NEGATIVE NEGATIVE mg/dL   Hgb urine dipstick NEGATIVE NEGATIVE   Bilirubin Urine NEGATIVE NEGATIVE   Ketones, ur 20 (A) NEGATIVE mg/dL   Protein, ur NEGATIVE NEGATIVE mg/dL   Nitrite NEGATIVE NEGATIVE   Leukocytes,Ua SMALL (A) NEGATIVE   RBC / HPF 0-5 0 - 5 RBC/hpf   WBC, UA 6-10 0 - 5 WBC/hpf   Bacteria, UA RARE (A) NONE SEEN   Squamous Epithelial / LPF 0-5 0 - 5   Mucus PRESENT   Wet prep, genital     Status: Abnormal   Collection Time: 07/10/19  1:30 PM   Specimen: PATH Cytology Cervicovaginal Ancillary Only  Result Value Ref Range   Yeast Wet Prep HPF POC NONE SEEN NONE SEEN   Trich, Wet Prep NONE SEEN NONE SEEN   Clue Cells Wet Prep HPF POC NONE SEEN NONE SEEN   WBC, Wet Prep HPF POC MANY (A) NONE SEEN   Sperm  NONE SEEN   ABO/Rh     Status: None   Collection Time: 07/10/19  1:39 PM  Result Value Ref Range   ABO/RH(D)      A NEG Performed at [facility], [address] [place], [place] [address]   CBC     Status: None   Collection Time: 07/10/19  1:40 PM  Result Value Ref Range   WBC 8.6 4.0 - 10.5 K/uL   RBC 4.70 3.87 - 5.11 MIL/uL   Hemoglobin 13.8 12.0 - 15.0 g/dL  HCT 41.8 36.0 - 46.0 %   MCV 88.9 80.0 - 100.0 fL   MCH 29.4 26.0 - 34.0 pg   MCHC 33.0 30.0 - 36.0 g/dL   RDW 16.113.0 09.611.5 - 04.515.5 %   Platelets 301 150 - 400 K/uL   nRBC 0.0 0.0 - 0.2 %   [place] Ob Less Than 14 Weeks With Ob Transvaginal  Result Date: 07/10/2019 CLINICAL DATA:  Abdominal pain EXAM: OBSTETRIC <14 WK US AND TRANSVAGINAL OB US TECHNIQUE: Both transabdominal and transvaginal ultrasound examinations were performed for complete evaluation of the gestation as well as the maternal uterus, adnexal regions, and pelvic cul-de-sac. Transvaginal technique was performed to assess early pregnancy. COMPARISON:  None. FINDINGS: Intrauterine gestational sac: Single Yolk sac:  Yes Embryo:  Yes Cardiac Activity: Yes Heart Rate: 138 bpm CRL:  11.2 mm   7 w   1 d                  US EDC: 02/25/2020 Subchorionic hemorrhage:  None visualized. Maternal uterus/adnexae: No adnexal mass.  No pelvic free fluid. IMPRESSION: Single live intrauterine pregnancy as detailed above. Electronically Signed   By: Elige KoHetal  Patel   On: 07/10/2019 14:21    MAU Course  Procedures  MDM -r/o ectopic -UA: hazy/20ketones/sm leuks/rare bacteria, sending urine for culture based on symptoms -CBC: WNL -US: single IUP, FHR 138, 5144w1d, US WNL -hCG: pending at time of discharge -ABO: A NEGATIVE -WetPrep: many WBCs, otherwise WNL -GC/CT collected -pt discharged to home in stable condition  Orders Placed This Encounter  Procedures  . Wet prep, genital    Standing Status:   Standing    Number of Occurrences:   1  . US OB LESS THAN 14 WEEKS WITH OB  TRANSVAGINAL    Standing Status:   Standing    Number of Occurrences:   1    Order Specific Question:   Symptom/Reason for Exam    Answer:   Pelvic pain in pregnancy [[id number]][[id number]]  . Urinalysis, Routine w reflex microscopic    Standing Status:   Standing    Number of Occurrences:   1  . CBC    Standing Status:   Standing    Number of Occurrences:   1  . hCG, quantitative, pregnancy    Standing Status:   Standing    Number of Occurrences:   1  . Pregnancy, urine POC    Standing Status:   Standing    Number of Occurrences:   1  . ABO/Rh    Standing Status:   Standing    Number of Occurrences:   1  . Discharge patient    Order Specific Question:   Discharge disposition    Answer:   01-Home or Self Care [1]    Order Specific Question:   Discharge patient date    Answer:   07/10/2019   No orders of the defined types were placed in this encounter.  Assessment and Plan   1. Pelvic pain in pregnancy   2. Intrauterine pregnancy   3. [redacted] weeks gestation of pregnancy    Allergies as of 07/10/2019      Reactions   Bee Venom Swelling      Medication List    STOP taking these medications   medroxyPROGESTERone 10 MG tablet Commonly known as: PROVERA     TAKE these medications   folic acid 1 MG tablet Commonly known as: FOLVITE Take 1 tablet (1 mg total) by mouth daily.  hydrOXYzine 25 MG tablet Commonly known as: ATARAX/VISTARIL Take 2 tablets (50 mg total) by mouth every 8 (eight) hours as needed.   LORazepam 1 MG tablet Commonly known as: Ativan Take 1 tablet (1 mg total) by mouth every 8 (eight) hours as needed for up to 7 doses for anxiety.   omeprazole 20 MG capsule Commonly known as: PRILOSEC Take 1 capsule (20 mg total) by mouth daily.   prenatal multivitamin Tabs tablet Take 1 tablet by mouth daily at 12 noon.   tiZANidine 4 MG tablet Commonly known as: Zanaflex Take 1 tablet (4 mg total) by mouth every 6 (six) hours as needed for muscle spasms.      -will  call with culture results, if positive -discussed RH negative status with patient and need for RhoGAM in the setting of unexpected vaginal bleeding -pt advised to pick OB and schedule NOB appt ASAP -work note given -pregnancy confirmation letter given -list of OB providers given -list of safe meds in pregnancy given -pt and husband questions asked and answered -pt discharged to home in stable condition  Joni Reiningicole E Tolulope Pinkett 07/10/2019, 2:35 PM

## 2019-07-10 NOTE — Discharge Instructions (Signed)
° °Abdominal Pain During Pregnancy ° °Abdominal pain is common during pregnancy, and has many possible causes. Some causes are more serious than others, and sometimes the cause is not known. Abdominal pain can be a sign that labor is starting. It can also be caused by normal growth and stretching of muscles and ligaments during pregnancy. Always tell your health care provider if you have any abdominal pain. °Follow these instructions at home: °· Do not have sex or put anything in your vagina until your pain goes away completely. °· Get plenty of rest until your pain improves. °· Drink enough fluid to keep your urine pale yellow. °· Take over-the-counter and prescription medicines only as told by your health care provider. °· Keep all follow-up visits as told by your health care provider. This is important. °Contact a health care provider if: °· Your pain continues or gets worse after resting. °· You have lower abdominal pain that: °? Comes and goes at regular intervals. °? Spreads to your back. °? Is similar to menstrual cramps. °· You have pain or burning when you urinate. °Get help right away if: °· You have a fever or chills. °· You have vaginal bleeding. °· You are leaking fluid from your vagina. °· You are passing tissue from your vagina. °· You have vomiting or diarrhea that lasts for more than 24 hours. °· Your baby is moving less than usual. °· You feel very weak or faint. °· You have shortness of breath. °· You develop severe pain in your upper abdomen. °Summary °· Abdominal pain is common during pregnancy, and has many possible causes. °· If you experience abdominal pain during pregnancy, tell your health care provider right away. °· Follow your health care provider's home care instructions and keep all follow-up visits as directed. °This information is not intended to replace advice given to you by your health care provider. Make sure you discuss any questions you have with your health care  provider. °Document Released: 10/10/2005 Document Revised: 01/28/2019 Document Reviewed: 01/12/2017 °Elsevier Patient Education © 2020 [employer] Inc[employer] ° ° ° ° °[facility]  ° ° ° °[facility]     Phone: [phone] ° °[facility]  Phone: [phone] ° °[facility]  Phone: [phone] ° °[facility]                           Phone: [phone] ° °[facility]          Phone: [phone] ° °[facility]    Phone: [phone]  ° °[facility]    Phone: [phone] ° °[facility]    Phone: [phone] ° °[facility]    Phone: [phone] ° °[facility]    Phone: [phone] ° °[facility]  Phone: [phone] ° °[facility]               Phone: [phone] ° °[facility]    Phone: [phone] ° °[facility]    Phone: [phone] ° ° ° ° ° ° ° ° °  ° ° ° °  ° °                 Safe Medications in Pregnancy  ° ° °Acne: °Benzoyl Peroxide °Salicylic Acid ° °Backache/Headache: °Tylenol: 2   regular strength every 4 hours OR °             2 Extra strength every 6 hours ° °Colds/Coughs/Allergies: °Benadryl (alcohol free) 25 mg every 6 hours as needed °Breath right strips °Claritin °Cepacol throat lozenges °Chloraseptic throat spray °Cold-Eeze- up to three times per day °Cough drops, alcohol free °Flonase (by prescription only) °Guaifenesin °Mucinex °Robitussin DM (plain only, alcohol free) °Saline nasal spray/drops °Sudafed (pseudoephedrine) & Actifed ** use only after [redacted] weeks gestation and if you do not have high blood pressure °Tylenol °Vicks Vaporub °Zinc lozenges °Zyrtec  ° °Constipation: °Colace °Ducolax suppositories °Fleet enema °Glycerin  suppositories °Metamucil °Milk of magnesia °Miralax °Senokot °Smooth move tea ° °Diarrhea: °Kaopectate °Imodium A-D ° °*NO pepto Bismol ° °Hemorrhoids: °Anusol °Anusol HC °Preparation H °Tucks ° °Indigestion: °Tums °Maalox °Mylanta °Zantac  °Pepcid ° °Insomnia: °Benadryl (alcohol free) 25mg every 6 hours as needed °Tylenol PM °Unisom, no Gelcaps ° °Leg Cramps: °Tums °MagGel ° °Nausea/Vomiting:  °Bonine °Dramamine °Emetrol °Ginger extract °Sea bands °Meclizine  °Nausea medication to take during pregnancy:  °Unisom (doxylamine succinate 25 mg tablets) Take one tablet daily at bedtime. If symptoms are not adequately controlled, the dose can be increased to a maximum recommended dose of two tablets daily (1/2 tablet in the morning, 1/2 tablet mid-afternoon and one at bedtime). °Vitamin B6 100mg tablets. Take one tablet twice a day (up to 200 mg per day). ° °Skin Rashes: °Aveeno products °Benadryl cream or 25mg every 6 hours as needed °Calamine Lotion °1% cortisone cream ° °Yeast infection: °Gyne-lotrimin 7 °Monistat 7 ° ° °**If taking multiple medications, please check labels to avoid duplicating the same active ingredients °**take medication as directed on the label °** Do not exceed 4000 mg of tylenol in 24 hours °**Do not take medications that contain aspirin or ibuprofen ° ° ° ° °

## 2019-07-11 LAB — CERVICOVAGINAL ANCILLARY ONLY
Chlamydia: NEGATIVE
Neisseria Gonorrhea: NEGATIVE

## 2019-08-07 ENCOUNTER — Inpatient Hospital Stay (HOSPITAL_COMMUNITY)
Admission: AD | Admit: 2019-08-07 | Discharge: 2019-08-07 | Disposition: A | Discharge status: Home / Self Care | Payer: BC Managed Care – PPO | Attending: Obstetrics and Gynecology | Admitting: Obstetrics and Gynecology

## 2019-08-07 ENCOUNTER — Other Ambulatory Visit: Payer: Self-pay

## 2019-08-07 ENCOUNTER — Encounter (HOSPITAL_COMMUNITY): Payer: Self-pay

## 2019-08-07 DIAGNOSIS — G43009 Migraine without aura, not intractable, without status migrainosus: Secondary | ICD-10-CM | POA: Diagnosis not present

## 2019-08-07 DIAGNOSIS — O219 Vomiting of pregnancy, unspecified: Secondary | ICD-10-CM | POA: Diagnosis not present

## 2019-08-07 DIAGNOSIS — O99351 Diseases of the nervous system complicating pregnancy, first trimester: Secondary | ICD-10-CM | POA: Insufficient documentation

## 2019-08-07 DIAGNOSIS — Z3A11 11 weeks gestation of pregnancy: Secondary | ICD-10-CM | POA: Diagnosis not present

## 2019-08-07 DIAGNOSIS — O26891 Other specified pregnancy related conditions, first trimester: Secondary | ICD-10-CM | POA: Diagnosis not present

## 2019-08-07 LAB — URINALYSIS, ROUTINE W REFLEX MICROSCOPIC
Bilirubin Urine: NEGATIVE
Glucose, UA: NEGATIVE mg/dL
Hgb urine dipstick: NEGATIVE
Ketones, ur: 20 mg/dL — AB
Nitrite: NEGATIVE
Protein, ur: 30 mg/dL — AB
Specific Gravity, Urine: 1.033 — ABNORMAL HIGH (ref 1.005–1.030)
pH: 5 (ref 5.0–8.0)

## 2019-08-07 MED ORDER — DIPHENHYDRAMINE HCL 50 MG/ML IJ SOLN
25.0000 mg | Freq: Once | INTRAMUSCULAR | Status: AC
  Administered 2019-08-07: 25 mg via INTRAVENOUS
  Filled 2019-08-07: qty 1

## 2019-08-07 MED ORDER — METOCLOPRAMIDE HCL 10 MG PO TABS: 10.0000 mg | ORAL_TABLET | Freq: Three times a day (TID) | ORAL | 0 refills | Status: DC | PRN

## 2019-08-07 MED ORDER — METOCLOPRAMIDE HCL 5 MG/ML IJ SOLN
10.0000 mg | Freq: Once | INTRAMUSCULAR | Status: AC
  Administered 2019-08-07: 10 mg via INTRAVENOUS
  Filled 2019-08-07: qty 2

## 2019-08-07 MED ORDER — DEXAMETHASONE SODIUM PHOSPHATE 10 MG/ML IJ SOLN
10.0000 mg | Freq: Once | INTRAMUSCULAR | Status: AC
  Administered 2019-08-07: 10 mg via INTRAVENOUS
  Filled 2019-08-07: qty 1

## 2019-08-07 MED ORDER — LACTATED RINGERS IV BOLUS
1000.0000 mL | Freq: Once | INTRAVENOUS | Status: AC
  Administered 2019-08-07: 1000 mL via INTRAVENOUS

## 2019-08-07 NOTE — Discharge Instructions (Signed)

## 2019-08-07 NOTE — MAU Note (Addendum)
Had migraine for 2.5 days.  Not able to keep anything down.no meds for nausea.  Gets migraines on a regular basis.  Took Tylenol, didn't touch it.  Feels dizzy. when she gets a HA, with any activity she gets winded, heart races and pounds.

## 2019-08-07 NOTE — MAU Provider Note (Signed)
Chief Complaint: Headache, Emesis, and Dizziness   First Provider Initiated Contact with Patient 08/07/19 1415     SUBJECTIVE HPI: Tammy Howard is a 28 y.o. G1P0000 at 6045w1d who presents to Maternity Admissions reporting migraines & n/v. Has hx of migraines. Went to a neurologist as a teenager but didn't follow up due to insurance issues. Treatment for her symptoms outside of pregnancy include advil migraine & daith piercing. Current symptoms started a few days ago. Took 1 ES tylenol last night without relief. Has not been able to keep foods or liquids down & feels dehydrated. States n/v is normal with her migraines. States she mainly came in d/t feeling dehydrated as she thinks her migraine is starting to back down.  Denies abdominal pain, fever, vaginal bleeding. [facility].   Location: head Quality: dull Severity: 3/10 on pain scale Duration: 2 days Timing: constant Modifying factors: not improved with tylenol Associated signs and symptoms: n/v  Past Medical History:  Diagnosis Date  . Hernia, epigastric   . Migraine with aura    OB History  Gravida Para Term Preterm AB Living  1 0 0 0 0 0  SAB TAB Ectopic Multiple Live Births  0 0 0 0 0    # Outcome Date GA Lbr Len/2nd Weight Sex Delivery Anes PTL Lv  1 Current            Past Surgical History:  Procedure Laterality Date  . EYE SURGERY     Social History   Socioeconomic History  . Marital status: Married    Spouse name: Not on file  . Number of children: Not on file  . Years of education: Not on file  . Highest education level: Not on file  Occupational History  . Not on file  Social Needs  . Financial resource strain: Not on file  . Food insecurity    Worry: Not on file    Inability: Not on file  . Transportation needs    Medical: Not on file    Non-medical: Not on file  Tobacco Use  . Smoking status: Never Smoker  . Smokeless tobacco: Never Used  Substance and Sexual Activity   . Alcohol use: Not Currently    Comment: occ  . Drug use: Not Currently    Types: Marijuana  . Sexual activity: Yes    Birth control/protection: None  Lifestyle  . Physical activity    Days per week: Not on file    Minutes per session: Not on file  . Stress: Not on file  Relationships  . Social Musicianconnections    Talks on phone: Not on file    Gets together: Not on file    Attends religious service: Not on file    Active member of club or organization: Not on file    Attends meetings of clubs or organizations: Not on file    Relationship status: Not on file  . Intimate partner violence    Fear of current or ex partner: Not on file    Emotionally abused: Not on file    Physically abused: Not on file    Forced sexual activity: Not on file  Other Topics Concern  . Not on file  Social History Narrative  . Not on file   History reviewed. No pertinent family history. No current facility-administered medications on file prior to encounter.    Current Outpatient Medications on File Prior to Encounter  Medication Sig Dispense Refill  .  Prenatal Vit-Fe Fumarate-FA (PRENATAL MULTIVITAMIN) TABS tablet Take 1 tablet by mouth daily at 12 noon.     Allergies  Allergen Reactions  . Bee Venom Swelling    I have reviewed patient's Past Medical Hx, Surgical Hx, Family Hx, Social Hx, medications and allergies.   Review of Systems  Constitutional: Negative.   Eyes: Positive for photophobia.  Gastrointestinal: Positive for nausea and vomiting. Negative for abdominal pain, constipation and diarrhea.  Genitourinary: Negative.   Neurological: Positive for headaches.    OBJECTIVE Patient Vitals for the past 24 hrs:  BP Temp Temp src Pulse Resp SpO2 Height Weight  08/07/19 1711 117/61 - - 68 18 100 % - -  08/07/19 1403 129/68 98.5 F (36.9 C) Oral 69 - - - -  08/07/19 1333 133/74 98.3 F (36.8 C) - 73 17 100 % 5' 5.5" (1.664 m) 115.3 kg   Constitutional: Well-developed, well-nourished  female in no acute distress.  Cardiovascular: normal rate & rhythm, no murmur Respiratory: normal rate and effort. Lung sounds clear throughout GI: Abd soft, non-tender, Pos BS x 4. No guarding or rebound tenderness MS: Extremities nontender, no edema, normal ROM Neurologic: Alert and oriented x 4.    LAB RESULTS Results for orders placed or performed during the hospital encounter of 08/07/19 (from the past 24 hour(s))  Urinalysis, Routine w reflex microscopic     Status: Abnormal   Collection Time: 08/07/19  2:03 PM  Result Value Ref Range   Color, Urine YELLOW YELLOW   APPearance HAZY (A) CLEAR   Specific Gravity, Urine 1.033 (H) 1.005 - 1.030   pH 5.0 5.0 - 8.0   Glucose, UA NEGATIVE NEGATIVE mg/dL   Hgb urine dipstick NEGATIVE NEGATIVE   Bilirubin Urine NEGATIVE NEGATIVE   Ketones, ur 20 (A) NEGATIVE mg/dL   Protein, ur 30 (A) NEGATIVE mg/dL   Nitrite NEGATIVE NEGATIVE   Leukocytes,Ua SMALL (A) NEGATIVE   RBC / HPF 0-5 0 - 5 RBC/hpf   WBC, UA 11-20 0 - 5 WBC/hpf   Bacteria, UA FEW (A) NONE SEEN   Squamous Epithelial / LPF 11-20 0 - 5   Mucus PRESENT     IMAGING No results found.  MAU COURSE Orders Placed This Encounter  Procedures  . Urinalysis, Routine w reflex microscopic  . Insert peripheral IV  . Discharge patient   Meds ordered this encounter  Medications  . AND Linked Order Group   . diphenhydrAMINE (BENADRYL) injection 25 mg   . metoCLOPramide (REGLAN) injection 10 mg   . dexamethasone (DECADRON) injection 10 mg  . lactated ringers bolus 1,000 mL  . metoCLOPramide (REGLAN) 10 MG tablet    Sig: Take 1 tablet (10 mg total) by mouth every 8 (eight) hours as needed for nausea (or headache, take with tylenol for headache).    Dispense:  30 tablet    Refill:  0    Order Specific Question:   Supervising Provider    Answer:   ERVIN, MICHAEL L [[id number]]    MDM U/a c/w dehydration. Discussed treatment of symptoms with patient including oral vs IV treatment. Pt  opts for IV treatment due to nausea & hydration status. Will give liter of IV fluids & headache cocktail (benadryl, decadron, reglan).   RN unable to doppler heart tones. BSUS performed Pt informed that the ultrasound is considered a limited OB ultrasound and is not intended to be a complete ultrasound exam.  Patient also informed that the ultrasound is not being completed with the  intent of assessing for fetal or placental anomalies or any pelvic abnormalities.  Explained that the purpose of today's ultrasound is to assess for  viability.  Patient acknowledges the purpose of the exam and the limitations of the study.  Live IUP, FHR 160s  Pt reports improvement in symptoms after IV fluids & meds. Ready for discharge home.   ASSESSMENT 1. Migraine without aura and without status migrainosus, not intractable   2. [redacted] weeks gestation of pregnancy     PLAN Discharge home in stable condition. Discussed reasons to return to MAU Rx reglan to take with tylenol for future headaches.  If headaches become more frequent discussed speaking with her ob for further recommendations Follow-up Information    [facility] Follow up.   Contact information: [place] [address] [phone]        [facility] Follow up.   Specialty: Obstetrics and Gynecology Why: return for worsening symptoms Contact information: [address] [id number] [place] [phone]         Allergies as of 08/07/2019      Reactions   Bee Venom Swelling      Medication List    STOP taking these medications   folic acid 1 MG tablet Commonly known as: FOLVITE   hydrOXYzine 25 MG tablet Commonly known as: ATARAX/VISTARIL   LORazepam 1 MG tablet Commonly known as: Ativan   omeprazole 20 MG capsule Commonly known as: PRILOSEC   tiZANidine 4 MG tablet Commonly known as: Zanaflex     TAKE these medications   metoCLOPramide 10 MG  tablet Commonly known as: REGLAN Take 1 tablet (10 mg total) by mouth every 8 (eight) hours as needed for nausea (or headache, take with tylenol for headache).   prenatal multivitamin Tabs tablet Take 1 tablet by mouth daily at 12 noon.        Jorje Guild, NP 08/07/2019  8:04 PM

## 2019-12-05 ENCOUNTER — Inpatient Hospital Stay (HOSPITAL_COMMUNITY)
Admission: AD | Admit: 2019-12-05 | Discharge: 2019-12-06 | Disposition: A | Discharge status: Home / Self Care | Payer: BC Managed Care – PPO | Attending: Obstetrics and Gynecology | Admitting: Obstetrics and Gynecology

## 2019-12-05 ENCOUNTER — Encounter (HOSPITAL_COMMUNITY): Payer: Self-pay | Admitting: Obstetrics and Gynecology

## 2019-12-05 ENCOUNTER — Other Ambulatory Visit: Payer: Self-pay

## 2019-12-05 DIAGNOSIS — W19XXXA Unspecified fall, initial encounter: Secondary | ICD-10-CM

## 2019-12-05 DIAGNOSIS — Y9301 Activity, walking, marching and hiking: Secondary | ICD-10-CM | POA: Insufficient documentation

## 2019-12-05 DIAGNOSIS — O9A213 Injury, poisoning and certain other consequences of external causes complicating pregnancy, third trimester: Secondary | ICD-10-CM

## 2019-12-05 DIAGNOSIS — W010XXA Fall on same level from slipping, tripping and stumbling without subsequent striking against object, initial encounter: Secondary | ICD-10-CM | POA: Insufficient documentation

## 2019-12-05 DIAGNOSIS — M5432 Sciatica, left side: Secondary | ICD-10-CM | POA: Insufficient documentation

## 2019-12-05 DIAGNOSIS — Z3A28 28 weeks gestation of pregnancy: Secondary | ICD-10-CM | POA: Insufficient documentation

## 2019-12-05 DIAGNOSIS — Z91030 Bee allergy status: Secondary | ICD-10-CM | POA: Insufficient documentation

## 2019-12-05 DIAGNOSIS — Y92019 Unspecified place in single-family (private) house as the place of occurrence of the external cause: Secondary | ICD-10-CM | POA: Insufficient documentation

## 2019-12-05 DIAGNOSIS — O99893 Other specified diseases and conditions complicating puerperium: Secondary | ICD-10-CM | POA: Insufficient documentation

## 2019-12-05 LAB — URINALYSIS, ROUTINE W REFLEX MICROSCOPIC
Bilirubin Urine: NEGATIVE
Glucose, UA: 50 mg/dL — AB
Hgb urine dipstick: NEGATIVE
Ketones, ur: 5 mg/dL — AB
Leukocytes,Ua: NEGATIVE
Nitrite: NEGATIVE
Protein, ur: NEGATIVE mg/dL
Specific Gravity, Urine: 1.025 (ref 1.005–1.030)
pH: 5 (ref 5.0–8.0)

## 2019-12-05 NOTE — MAU Note (Signed)
Pt states that she was coming out of her aunts house about an hour ago around 2030 and stepped in mud and slipped backwards onto her left hip.   Denies vaginal bleeding or LOF.   Reports +FM

## 2019-12-05 NOTE — MAU Provider Note (Signed)
Chief Complaint:  Fall   First Provider Initiated Contact with Patient 12/05/19 2140     HPI: Tammy Howard is a 29 y.o. G1P0000 at 24w2dwho presents to maternity admissions reporting fall tonight.  Landed on left hip.  No abdominal pain or bleeding.  Feels fetal movement. She reports good fetal movement, denies LOF, vaginal bleeding, vaginal itching/burning, urinary symptoms, h/a, dizziness, n/v, diarrhea, constipation or fever/chills.  She denies headache, visual changes or RUQ abdominal pain.  Fall The accident occurred 1 to 3 hours ago. The fall occurred while walking. She fell from a height of 1 to 2 ft. She landed on grass. There was no blood loss. The point of impact was the left hip. The patient is experiencing no pain. Pertinent negatives include no abdominal pain, fever, headaches, loss of consciousness, numbness or tingling. She has tried nothing for the symptoms.    RN Note: Pt states that she was coming out of her aunts house about an hour ago around 2030 and stepped in mud and slipped backwards onto her left hip. Denies vaginal bleeding or LOF. Reports +FM   Past Medical History: Past Medical History:  Diagnosis Date  . Hernia, epigastric   . Migraine with aura     Past obstetric history: OB History  Gravida Para Term Preterm AB Living  1 0 0 0 0 0  SAB TAB Ectopic Multiple Live Births  0 0 0 0 0    # Outcome Date GA Lbr Len/2nd Weight Sex Delivery Anes PTL Lv  1 Current             Past Surgical History: Past Surgical History:  Procedure Laterality Date  . EYE SURGERY      Family History: Family History  Problem Relation Age of Onset  . Chiari malformation Paternal Great-grandmother     Social History: Social History   Tobacco Use  . Smoking status: Never Smoker  . Smokeless tobacco: Never Used  Substance Use Topics  . Alcohol use: Not Currently    Comment: occ  . Drug use: Not Currently    Types: Marijuana    Allergies:  Allergies  Allergen  Reactions  . Bee Venom Swelling    Meds:  Medications Prior to Admission  Medication Sig Dispense Refill Last Dose  . metoCLOPramide (REGLAN) 10 MG tablet Take 1 tablet (10 mg total) by mouth every 8 (eight) hours as needed for nausea (or headache, take with tylenol for headache). 30 tablet 0 Past Week at Unknown time  . Prenatal Vit-Fe Fumarate-FA (PRENATAL MULTIVITAMIN) TABS tablet Take 1 tablet by mouth daily at 12 noon.   12/05/2019 at Unknown time    I have reviewed patient's Past Medical Hx, Surgical Hx, Family Hx, Social Hx, medications and allergies.   ROS:  Review of Systems  Constitutional: Negative for fever.  Gastrointestinal: Negative for abdominal pain.  Genitourinary: Negative for pelvic pain and vaginal bleeding.  Neurological: Negative for tingling, loss of consciousness, numbness and headaches.   Other systems negative  Physical Exam   Patient Vitals for the past 24 hrs:  BP Temp Pulse Resp SpO2 Weight  12/05/19 2131 135/66 98.4 F (36.9 C) (!) 102 20 98 % --  12/05/19 2119 -- -- -- -- -- 131.5 kg   Constitutional: Well-developed, well-nourished female in no acute distress.  Cardiovascular: normal rate and rhythm Respiratory: normal effort, clear to auscultation bilaterally GI: Abd soft, non-tender, gravid appropriate for gestational age.   No rebound or guarding. MS: Extremities  nontender, no edema, normal ROM Neurologic: Alert and oriented x 4.  GU: Neg CVAT.  PELVIC EXAM: deferred  FHT:  Baseline 135 , moderate variability, accelerations present, no decelerations Contractions: Occasional   Labs: Results for orders placed or performed during the hospital encounter of 12/05/19 (from the past 24 hour(s))  Urinalysis, Routine w reflex microscopic     Status: Abnormal   Collection Time: 12/05/19  9:28 PM  Result Value Ref Range   Color, Urine YELLOW YELLOW   APPearance HAZY (A) CLEAR   Specific Gravity, Urine 1.025 1.005 - 1.030   pH 5.0 5.0 - 8.0    Glucose, UA 50 (A) NEGATIVE mg/dL   Hgb urine dipstick NEGATIVE NEGATIVE   Bilirubin Urine NEGATIVE NEGATIVE   Ketones, ur 5 (A) NEGATIVE mg/dL   Protein, ur NEGATIVE NEGATIVE mg/dL   Nitrite NEGATIVE NEGATIVE   Leukocytes,Ua NEGATIVE NEGATIVE   --/--/A NEG (09/16 1339)  Imaging:  No results found.  MAU Course/MDM: I have ordered labs and reviewed results. Urine is clear NST reviewed, reactive with only occasional contractions, monitored her for 4 hours  Treatments in MAU included EFM x 4 hours, Flexeril for sciatic pain.  Will prescribe some Flexeril for home use prn spasm.    Assessment: SIngle intrauterine pregnancy at [redacted]w[redacted]d S/P fall Left sciatic pain  Plan: Discharge home Rx Flexeril for prn  Use for spasm at home Has appt tomorrow in office Recommend kick counts, and return if she has any contractions  Hansel Feinstein CNM, MSN Certified Nurse-Midwife 12/05/2019 10:42 PM

## 2019-12-06 DIAGNOSIS — Z3A28 28 weeks gestation of pregnancy: Secondary | ICD-10-CM | POA: Diagnosis not present

## 2019-12-06 DIAGNOSIS — W010XXA Fall on same level from slipping, tripping and stumbling without subsequent striking against object, initial encounter: Secondary | ICD-10-CM | POA: Diagnosis not present

## 2019-12-06 DIAGNOSIS — Y9301 Activity, walking, marching and hiking: Secondary | ICD-10-CM | POA: Diagnosis not present

## 2019-12-06 DIAGNOSIS — Z043 Encounter for examination and observation following other accident: Secondary | ICD-10-CM | POA: Diagnosis present

## 2019-12-06 DIAGNOSIS — O99893 Other specified diseases and conditions complicating puerperium: Secondary | ICD-10-CM | POA: Diagnosis not present

## 2019-12-06 DIAGNOSIS — Y92019 Unspecified place in single-family (private) house as the place of occurrence of the external cause: Secondary | ICD-10-CM | POA: Diagnosis not present

## 2019-12-06 DIAGNOSIS — M5432 Sciatica, left side: Secondary | ICD-10-CM | POA: Diagnosis not present

## 2019-12-06 DIAGNOSIS — Z91030 Bee allergy status: Secondary | ICD-10-CM | POA: Diagnosis not present

## 2019-12-06 DIAGNOSIS — W19XXXA Unspecified fall, initial encounter: Secondary | ICD-10-CM | POA: Diagnosis not present

## 2019-12-06 DIAGNOSIS — O9A213 Injury, poisoning and certain other consequences of external causes complicating pregnancy, third trimester: Secondary | ICD-10-CM | POA: Diagnosis not present

## 2019-12-06 MED ORDER — CYCLOBENZAPRINE HCL 10 MG PO TABS: 10.0000 mg | ORAL_TABLET | Freq: Three times a day (TID) | ORAL | 0 refills | Status: DC | PRN

## 2019-12-06 MED ORDER — CYCLOBENZAPRINE HCL 5 MG PO TABS
10.0000 mg | ORAL_TABLET | Freq: Once | ORAL | Status: AC
  Administered 2019-12-06: 10 mg via ORAL
  Filled 2019-12-06: qty 2

## 2019-12-06 NOTE — Discharge Instructions (Signed)
Sciatica  Sciatica is pain, numbness, weakness, or tingling along the path of the sciatic nerve. The sciatic nerve starts in the lower back and runs down the back of each leg. The nerve controls the muscles in the lower leg and in the back of the knee. It also provides feeling (sensation) to the back of the thigh, the lower leg, and the sole of the foot. Sciatica is a symptom of another medical condition that pinches or puts pressure on the sciatic nerve. Sciatica most often only affects one side of the body. Sciatica usually goes away on its own or with treatment. In some cases, sciatica may come back (recur). What are the causes? This condition is caused by pressure on the sciatic nerve or pinching of the nerve. This may be the result of:  A disk in between the bones of the spine bulging out too far (herniated disk).  Age-related changes in the spinal disks.  A pain disorder that affects a muscle in the buttock.  Extra bone growth near the sciatic nerve.  A break (fracture) of the pelvis.  Pregnancy.  Tumor. This is rare. What increases the risk? The following factors may make you more likely to develop this condition:  Playing sports that place pressure or stress on the spine.  Having poor strength and flexibility.  A history of back injury or surgery.  Sitting for long periods of time.  Doing activities that involve repetitive bending or lifting.  Obesity. What are the signs or symptoms? Symptoms can vary from mild to very severe, and they may include:  Any of these problems in the lower back, leg, hip, or buttock: ? Mild tingling, numbness, or dull aches. ? Burning sensations. ? Sharp pains.  Numbness in the back of the calf or the sole of the foot.  Leg weakness.  Severe back pain that makes movement difficult. Symptoms may get worse when you cough, sneeze, or laugh, or when you sit or stand for long periods of time. How is this diagnosed? This condition may be  diagnosed based on:  Your symptoms and medical history.  A physical exam.  Blood tests.  Imaging tests, such as: ? X-rays. ? MRI. ? CT scan. How is this treated? In many cases, this condition improves on its own without treatment. However, treatment may include:  Reducing or modifying physical activity.  Exercising and stretching.  Icing and applying heat to the affected area.  Medicines that help to: ? Relieve pain and swelling. ? Relax your muscles.  Injections of medicines that help to relieve pain, irritation, and inflammation around the sciatic nerve (steroids).  Surgery. Follow these instructions at home: Medicines  Take over-the-counter and prescription medicines only as told by your health care provider.  Ask your health care provider if the medicine prescribed to you: ? Requires you to avoid driving or using heavy machinery. ? Can cause constipation. You may need to take these actions to prevent or treat constipation:  Drink enough fluid to keep your urine pale yellow.  Take over-the-counter or prescription medicines.  Eat foods that are high in fiber, such as beans, whole grains, and fresh fruits and vegetables.  Limit foods that are high in fat and processed sugars, such as fried or sweet foods. Managing pain      If directed, put ice on the affected area. ? Put ice in a plastic bag. ? Place a towel between your skin and the bag. ? Leave the ice on for 20 minutes,   2-3 times a day.  If directed, apply heat to the affected area. Use the heat source that your health care provider recommends, such as a moist heat pack or a heating pad. ? Place a towel between your skin and the heat source. ? Leave the heat on for 20-30 minutes. ? Remove the heat if your skin turns bright red. This is especially important if you are unable to feel pain, heat, or cold. You may have a greater risk of getting burned. Activity   Return to your normal activities as told  by your health care provider. Ask your health care provider what activities are safe for you.  Avoid activities that make your symptoms worse.  Take brief periods of rest throughout the day. ? When you rest for longer periods, mix in some mild activity or stretching between periods of rest. This will help to prevent stiffness and pain. ? Avoid sitting for long periods of time without moving. Get up and move around at least one time each hour.  Exercise and stretch regularly, as told by your health care provider.  Do not lift anything that is heavier than 10 lb (4.5 kg) while you have symptoms of sciatica. When you do not have symptoms, you should still avoid heavy lifting, especially repetitive heavy lifting.  When you lift objects, always use proper lifting technique, which includes: ? Bending your knees. ? Keeping the load close to your body. ? Avoiding twisting. General instructions  Maintain a healthy weight. Excess weight puts extra stress on your back.  Wear supportive, comfortable shoes. Avoid wearing high heels.  Avoid sleeping on a mattress that is too soft or too hard. A mattress that is firm enough to support your back when you sleep may help to reduce your pain.  Keep all follow-up visits as told by your health care provider. This is important. Contact a health care provider if:  You have pain that: ? Wakes you up when you are sleeping. ? Gets worse when you lie down. ? Is worse than you have experienced in the past. ? Lasts longer than 4 weeks.  You have an unexplained weight loss. Get help right away if:  You are not able to control when you urinate or have bowel movements (incontinence).  You have: ? Weakness in your lower back, pelvis, buttocks, or legs that gets worse. ? Redness or swelling of your back. ? A burning sensation when you urinate. Summary  Sciatica is pain, numbness, weakness, or tingling along the path of the sciatic nerve.  This condition  is caused by pressure on the sciatic nerve or pinching of the nerve.  Sciatica can cause pain, numbness, or tingling in the lower back, legs, hips, and buttocks.  Treatment often includes rest, exercise, medicines, and applying ice or heat. This information is not intended to replace advice given to you by your health care provider. Make sure you discuss any questions you have with your health care provider. Document Revised: 10/29/2018 Document Reviewed: 10/29/2018 Elsevier Patient Education  2020 [employer].  Third Trimester of Pregnancy The third trimester is from week 28 through week 40 (months 7 through 9). The third trimester is a time when the unborn baby (fetus) is growing rapidly. At the end of the ninth month, the fetus is about 20 inches in length and weighs 6-10 pounds. Body changes during your third trimester Your body will continue to go through many changes during pregnancy. The changes vary from woman to woman. During  the third trimester:  Your weight will continue to increase. You can expect to gain 25-35 pounds (11-16 kg) by the end of the pregnancy.  You may begin to get stretch marks on your hips, abdomen, and breasts.  You may urinate more often because the fetus is moving lower into your pelvis and pressing on your bladder.  You may develop or continue to have heartburn. This is caused by increased hormones that slow down muscles in the digestive tract.  You may develop or continue to have constipation because increased hormones slow digestion and cause the muscles that push waste through your intestines to relax.  You may develop hemorrhoids. These are swollen veins (varicose veins) in the rectum that can itch or be painful.  You may develop swollen, bulging veins (varicose veins) in your legs.  You may have increased body aches in the pelvis, back, or thighs. This is due to weight gain and increased hormones that are relaxing your joints.  You may have changes  in your hair. These can include thickening of your hair, rapid growth, and changes in texture. Some women also have hair loss during or after pregnancy, or hair that feels dry or thin. Your hair will most likely return to normal after your baby is born.  Your breasts will continue to grow and they will continue to become tender. A yellow fluid (colostrum) may leak from your breasts. This is the first milk you are producing for your baby.  Your belly button may stick out.  You may notice more swelling in your hands, face, or ankles.  You may have increased tingling or numbness in your hands, arms, and legs. The skin on your belly may also feel numb.  You may feel short of breath because of your expanding uterus.  You may have more problems sleeping. This can be caused by the size of your belly, increased need to urinate, and an increase in your body's metabolism.  You may notice the fetus "dropping," or moving lower in your abdomen (lightening).  You may have increased vaginal discharge.  You may notice your joints feel loose and you may have pain around your pelvic bone. What to expect at prenatal visits You will have prenatal exams every 2 weeks until week 36. Then you will have weekly prenatal exams. During a routine prenatal visit:  You will be weighed to make sure you and the baby are growing normally.  Your blood pressure will be taken.  Your abdomen will be measured to track your baby's growth.  The fetal heartbeat will be listened to.  Any test results from the previous visit will be discussed.  You may have a cervical check near your due date to see if your cervix has softened or thinned (effaced).  You will be tested for Group B streptococcus. This happens between 35 and 37 weeks. Your health care provider may ask you:  What your birth plan is.  How you are feeling.  If you are feeling the baby move.  If you have had any abnormal symptoms, such as leaking fluid,  bleeding, severe headaches, or abdominal cramping.  If you are using any tobacco products, including cigarettes, chewing tobacco, and electronic cigarettes.  If you have any questions. Other tests or screenings that may be performed during your third trimester include:  Blood tests that check for low iron levels (anemia).  Fetal testing to check the health, activity level, and growth of the fetus. Testing is done if you have  certain medical conditions or if there are problems during the pregnancy.  Nonstress test (NST). This test checks the health of your baby to make sure there are no signs of problems, such as the baby not getting enough oxygen. During this test, a belt is placed around your belly. The baby is made to move, and its heart rate is monitored during movement. What is false labor? False labor is a condition in which you feel small, irregular tightenings of the muscles in the womb (contractions) that usually go away with rest, changing position, or drinking water. These are called Braxton Hicks contractions. Contractions may last for hours, days, or even weeks before true labor sets in. If contractions come at regular intervals, become more frequent, increase in intensity, or become painful, you should see your health care provider. What are the signs of labor?  Abdominal cramps.  Regular contractions that start at 10 minutes apart and become stronger and more frequent with time.  Contractions that start on the top of the uterus and spread down to the lower abdomen and back.  Increased pelvic pressure and dull back pain.  A watery or bloody mucus discharge that comes from the vagina.  Leaking of amniotic fluid. This is also known as your "water breaking." It could be a slow trickle or a gush. Let your health care provider know if it has a color or strange odor. If you have any of these signs, call your health care provider right away, even if it is before your due  date. Follow these instructions at home: Medicines  Follow your health care provider's instructions regarding medicine use. Specific medicines may be either safe or unsafe to take during pregnancy.  Take a prenatal vitamin that contains at least 600 micrograms (mcg) of folic acid.  If you develop constipation, try taking a stool softener if your health care provider approves. Eating and drinking   Eat a balanced diet that includes fresh fruits and vegetables, whole grains, good sources of protein such as meat, eggs, or tofu, and low-fat dairy. Your health care provider will help you determine the amount of weight gain that is right for you.  Avoid raw meat and uncooked cheese. These carry germs that can cause birth defects in the baby.  If you have low calcium intake from food, talk to your health care provider about whether you should take a daily calcium supplement.  Eat four or five small meals rather than three large meals a day.  Limit foods that are high in fat and processed sugars, such as fried and sweet foods.  To prevent constipation: ? Drink enough fluid to keep your urine clear or pale yellow. ? Eat foods that are high in fiber, such as fresh fruits and vegetables, whole grains, and beans. Activity  Exercise only as directed by your health care provider. Most women can continue their usual exercise routine during pregnancy. Try to exercise for 30 minutes at least 5 days a week. Stop exercising if you experience uterine contractions.  Avoid heavy lifting.  Do not exercise in extreme heat or humidity, or at high altitudes.  Wear low-heel, comfortable shoes.  Practice good posture.  You may continue to have sex unless your health care provider tells you otherwise. Relieving pain and discomfort  Take frequent breaks and rest with your legs elevated if you have leg cramps or low back pain.  Take warm sitz baths to soothe any pain or discomfort caused by hemorrhoids.  Use hemorrhoid cream  if your health care provider approves.  Wear a good support bra to prevent discomfort from breast tenderness.  If you develop varicose veins: ? Wear support pantyhose or compression stockings as told by your healthcare provider. ? Elevate your feet for 15 minutes, 3-4 times a day. Prenatal care  Write down your questions. Take them to your prenatal visits.  Keep all your prenatal visits as told by your health care provider. This is important. Safety  Wear your seat belt at all times when driving.  Make a list of emergency phone numbers, including numbers for family, friends, the hospital, and police and fire departments. General instructions  Avoid cat litter boxes and soil used by cats. These carry germs that can cause birth defects in the baby. If you have a cat, ask someone to clean the litter box for you.  Do not travel far distances unless it is absolutely necessary and only with the approval of your health care provider.  Do not use hot tubs, steam rooms, or saunas.  Do not drink alcohol.  Do not use any products that contain nicotine or tobacco, such as cigarettes and e-cigarettes. If you need help quitting, ask your health care provider.  Do not use any medicinal herbs or unprescribed drugs. These chemicals affect the formation and growth of the baby.  Do not douche or use tampons or scented sanitary pads.  Do not cross your legs for long periods of time.  To prepare for the arrival of your baby: ? Take prenatal classes to understand, practice, and ask questions about labor and delivery. ? Make a trial run to the hospital. ? Visit the hospital and tour the maternity area. ? Arrange for maternity or paternity leave through employers. ? Arrange for family and friends to take care of pets while you are in the hospital. ? Purchase a rear-facing car seat and make sure you know how to install it in your car. ? Pack your hospital bag. ? Prepare the  baby's nursery. Make sure to remove all pillows and stuffed animals from the baby's crib to prevent suffocation.  Visit your dentist if you have not gone during your pregnancy. Use a soft toothbrush to brush your teeth and be gentle when you floss. Contact a health care provider if:  You are unsure if you are in labor or if your water has broken.  You become dizzy.  You have mild pelvic cramps, pelvic pressure, or nagging pain in your abdominal area.  You have lower back pain.  You have persistent nausea, vomiting, or diarrhea.  You have an unusual or bad smelling vaginal discharge.  You have pain when you urinate. Get help right away if:  Your water breaks before 37 weeks.  You have regular contractions less than 5 minutes apart before 37 weeks.  You have a fever.  You are leaking fluid from your vagina.  You have spotting or bleeding from your vagina.  You have severe abdominal pain or cramping.  You have rapid weight loss or weight gain.  You have shortness of breath with chest pain.  You notice sudden or extreme swelling of your face, hands, ankles, feet, or legs.  Your baby makes fewer than 10 movements in 2 hours.  You have severe headaches that do not go away when you take medicine.  You have vision changes. Summary  The third trimester is from week 28 through week 40, months 7 through 9. The third trimester is a time when the unborn  baby (fetus) is growing rapidly.  During the third trimester, your discomfort may increase as you and your baby continue to gain weight. You may have abdominal, leg, and back pain, sleeping problems, and an increased need to urinate.  During the third trimester your breasts will keep growing and they will continue to become tender. A yellow fluid (colostrum) may leak from your breasts. This is the first milk you are producing for your baby.  False labor is a condition in which you feel small, irregular tightenings of the muscles  in the womb (contractions) that eventually go away. These are called Braxton Hicks contractions. Contractions may last for hours, days, or even weeks before true labor sets in.  Signs of labor can include: abdominal cramps; regular contractions that start at 10 minutes apart and become stronger and more frequent with time; watery or bloody mucus discharge that comes from the vagina; increased pelvic pressure and dull back pain; and leaking of amniotic fluid. This information is not intended to replace advice given to you by your health care provider. Make sure you discuss any questions you have with your health care provider. Document Revised: 01/31/2019 Document Reviewed: 11/15/2016 Elsevier Patient Education  2020 [employer].

## 2020-01-07 ENCOUNTER — Inpatient Hospital Stay (HOSPITAL_COMMUNITY)
Admission: AD | Admit: 2020-01-07 | Discharge: 2020-01-07 | Disposition: A | Discharge status: Home / Self Care | Payer: BC Managed Care – PPO | Attending: Obstetrics and Gynecology | Admitting: Obstetrics and Gynecology

## 2020-01-07 ENCOUNTER — Other Ambulatory Visit: Payer: Self-pay

## 2020-01-07 ENCOUNTER — Encounter (HOSPITAL_COMMUNITY): Payer: Self-pay | Admitting: Obstetrics and Gynecology

## 2020-01-07 DIAGNOSIS — O139 Gestational [pregnancy-induced] hypertension without significant proteinuria, unspecified trimester: Secondary | ICD-10-CM

## 2020-01-07 DIAGNOSIS — O133 Gestational [pregnancy-induced] hypertension without significant proteinuria, third trimester: Secondary | ICD-10-CM | POA: Diagnosis not present

## 2020-01-07 DIAGNOSIS — Z3A33 33 weeks gestation of pregnancy: Secondary | ICD-10-CM | POA: Diagnosis not present

## 2020-01-07 DIAGNOSIS — R030 Elevated blood-pressure reading, without diagnosis of hypertension: Secondary | ICD-10-CM | POA: Diagnosis present

## 2020-01-07 LAB — CBC
HCT: 39 % (ref 36.0–46.0)
Hemoglobin: 12.6 g/dL (ref 12.0–15.0)
MCH: 29.2 pg (ref 26.0–34.0)
MCHC: 32.3 g/dL (ref 30.0–36.0)
MCV: 90.3 fL (ref 80.0–100.0)
Platelets: 255 10*3/uL (ref 150–400)
RBC: 4.32 MIL/uL (ref 3.87–5.11)
RDW: 13.6 % (ref 11.5–15.5)
WBC: 10.3 10*3/uL (ref 4.0–10.5)
nRBC: 0 % (ref 0.0–0.2)

## 2020-01-07 LAB — COMPREHENSIVE METABOLIC PANEL
ALT: 11 U/L (ref 0–44)
AST: 14 U/L — ABNORMAL LOW (ref 15–41)
Albumin: 2.6 g/dL — ABNORMAL LOW (ref 3.5–5.0)
Alkaline Phosphatase: 118 U/L (ref 38–126)
Anion gap: 8 (ref 5–15)
BUN: 6 mg/dL (ref 6–20)
CO2: 23 mmol/L (ref 22–32)
Calcium: 8.7 mg/dL — ABNORMAL LOW (ref 8.9–10.3)
Chloride: 107 mmol/L (ref 98–111)
Creatinine, Ser: 0.49 mg/dL (ref 0.44–1.00)
GFR calc Af Amer: >60 mL/min (ref >60)
GFR calc non Af Amer: >60 mL/min (ref >60)
Glucose, Bld: 79 mg/dL (ref 70–99)
Potassium: 3.7 mmol/L (ref 3.5–5.1)
Sodium: 138 mmol/L (ref 135–145)
Total Bilirubin: 0.1 mg/dL — ABNORMAL LOW (ref 0.3–1.2)
Total Protein: 5.7 g/dL — ABNORMAL LOW (ref 6.5–8.1)

## 2020-01-07 LAB — PROTEIN / CREATININE RATIO, URINE
Creatinine, Urine: 251.31 mg/dL
Protein Creatinine Ratio: 0.04 mg/mg{Cre} (ref 0.00–0.15)
Total Protein, Urine: 11 mg/dL

## 2020-01-07 MED ORDER — LABETALOL HCL 5 MG/ML IV SOLN: 40.0000 mg | INTRAVENOUS | Status: DC | PRN

## 2020-01-07 MED ORDER — LABETALOL HCL 100 MG PO TABS
100.0000 mg | ORAL_TABLET | Freq: Once | ORAL | Status: AC
  Administered 2020-01-07: 100 mg via ORAL
  Filled 2020-01-07: qty 1

## 2020-01-07 MED ORDER — DIPHENHYDRAMINE HCL 50 MG/ML IJ SOLN
25.0000 mg | Freq: Once | INTRAMUSCULAR | Status: AC
  Administered 2020-01-07: 25 mg via INTRAVENOUS
  Filled 2020-01-07: qty 1

## 2020-01-07 MED ORDER — LABETALOL HCL 5 MG/ML IV SOLN: 80.0000 mg | INTRAVENOUS | Status: DC | PRN

## 2020-01-07 MED ORDER — HYDRALAZINE HCL 20 MG/ML IJ SOLN: 10.0000 mg | INTRAMUSCULAR | Status: DC | PRN

## 2020-01-07 MED ORDER — LABETALOL HCL 100 MG PO TABS: 100.0000 mg | ORAL_TABLET | Freq: Two times a day (BID) | ORAL | 3 refills | Status: DC

## 2020-01-07 MED ORDER — LABETALOL HCL 5 MG/ML IV SOLN
20.0000 mg | INTRAVENOUS | Status: DC | PRN
  Administered 2020-01-07: 20 mg via INTRAVENOUS
  Filled 2020-01-07: qty 4

## 2020-01-07 MED ORDER — METOCLOPRAMIDE HCL 5 MG/ML IJ SOLN
10.0000 mg | Freq: Once | INTRAMUSCULAR | Status: AC
  Administered 2020-01-07: 10 mg via INTRAVENOUS
  Filled 2020-01-07: qty 2

## 2020-01-07 MED ORDER — DEXAMETHASONE SODIUM PHOSPHATE 10 MG/ML IJ SOLN
10.0000 mg | Freq: Once | INTRAMUSCULAR | Status: AC
  Administered 2020-01-07: 10 mg via INTRAVENOUS
  Filled 2020-01-07: qty 1

## 2020-01-07 MED ORDER — LACTATED RINGERS IV SOLN: INTRAVENOUS | Status: DC

## 2020-01-07 NOTE — MAU Provider Note (Signed)
History     CSN: [id number]  Arrival date and time: 01/07/20 1744   First Provider Initiated Contact with Patient 01/07/20 1843      No chief complaint on file.  HPI Tammy Howard is a 29 y.o. G1P0000 at [redacted]w[redacted]d who presents to MAU from clinic for evaluation of elevated blood pressures. This is a new problem, onset today.  Patient also c/o new onset headache, not responsive to Tylenol. She endorses history of migraines but states she does not typically take medication for this complaint. She rates her pain as 8/10 on initial assessment. Her pain is anterior, bilateral and does not radiate.   She denies visual disturbances, RUQ/epigastric pain, new onset swelling or weight gain.  She denies abdominal pain, vaginal bleeding, leaking of fluid, decreased fetal movement, fever, falls, or recent illness.   She receives care with [facility].  OB History    Gravida  1   Para  0   Term  0   Preterm  0   AB  0   Living  0     SAB  0   TAB  0   Ectopic  0   Multiple  0   Live Births  0           Past Medical History:  Diagnosis Date  . Hernia, epigastric   . Migraine with aura     Past Surgical History:  Procedure Laterality Date  . EYE SURGERY      Family History  Problem Relation Age of Onset  . Chiari malformation Paternal Great-grandmother   . Meniere's disease Mother   . COPD Father     Social History   Tobacco Use  . Smoking status: Never Smoker  . Smokeless tobacco: Never Used  Substance Use Topics  . Alcohol use: Not Currently    Comment: occ  . Drug use: Not Currently    Types: Marijuana    Comment: last smoked 2020    Allergies:  Allergies  Allergen Reactions  . Other Anaphylaxis  . Bee Venom Swelling    Medications Prior to Admission  Medication Sig Dispense Refill Last Dose  . cyclobenzaprine (FLEXERIL) 10 MG tablet Take 1 tablet (10 mg total) by mouth 3 (three) times daily as needed for muscle spasms. 20 tablet 0 01/07/2020 at  0300  . Prenatal Vit-Fe Fumarate-FA (PRENATAL MULTIVITAMIN) TABS tablet Take 1 tablet by mouth daily at 12 noon.   01/07/2020 at 0700  . medroxyPROGESTERone (PROVERA) 10 MG tablet Take by mouth.     . metoCLOPramide (REGLAN) 10 MG tablet Take 1 tablet (10 mg total) by mouth every 8 (eight) hours as needed for nausea (or headache, take with tylenol for headache). 30 tablet 0 More than a month at Unknown time  . metroNIDAZOLE (METROGEL) 0.75 % vaginal gel Place vaginally at bedtime.       Review of Systems  Constitutional: Negative for fever.  Eyes: Negative for photophobia and visual disturbance.  Gastrointestinal: Negative for abdominal pain.  Musculoskeletal: Negative for back pain.  Neurological: Positive for headaches.  All other systems reviewed and are negative.  Physical Exam   Blood pressure (!) 161/94, pulse 76, resp. rate 18, height 5\' 5"  (1.651 m), weight (!) 136.5 kg, last menstrual period 05/09/2019, SpO2 100 %.  Physical Exam  Nursing note and vitals reviewed. Constitutional: She is oriented to person, place, and time. She appears well-developed and well-nourished.  Cardiovascular: Normal rate and normal heart sounds.  Respiratory: Effort  normal and breath sounds normal.  GI: Soft.  Gravid  Neurological: She is alert and oriented to person, place, and time. She has normal reflexes.  Skin: Skin is warm and dry.  Psychiatric: She has a normal mood and affect. Her behavior is normal. Judgment and thought content normal.    MAU Course  Procedures  --Preeclampsia Protocol initiated for severe range blood pressure x 2 --Lab results discussed with Drs Nehemiah Settle and Ronita Hipps. Per Dr. Ronita Hipps, patient given option of obs in [place] vs discharge home on Labetalol with close outpatient surveillance. Patient verbalizes concern that she will be fired if she misses work, strongly prefers option for outpatient management. Orders placed accordingly --Headache pain reduced to 3/10 after IV  headache cocktail --Reactive tracing: baseline 145, mod variability, pos 15 x 15 accels, no decels --Toco: quiet  Orders Placed This Encounter  Procedures  . Protein / creatinine ratio, urine    Standing Status:   Standing    Number of Occurrences:   1  . CBC    Standing Status:   Standing    Number of Occurrences:   1  . Comprehensive metabolic panel    Standing Status:   Standing    Number of Occurrences:   1  . Measure blood pressure    Standing Status:   Standing    Number of Occurrences:   1  . Notify Physician    Confirmatory reading of BP> 160/110 15 minutes later    Standing Status:   Standing    Number of Occurrences:   1    Order Specific Question:   Notify Physician    Answer:   Temp greater than or equal to 100.4    Order Specific Question:   Notify Physician    Answer:   RR greater than 24 or less than 10    Order Specific Question:   Notify Physician    Answer:   HR greater than 120 or less than 50    Order Specific Question:   Notify Physician    Answer:   SBP greater than 160 mmHG or less than 80 mmHG    Order Specific Question:   Notify Physician    Answer:   DBP greater than 110 mmHG or less than 45 mmHG    Order Specific Question:   Notify Physician    Answer:   Urinary output is less than 135ml for any 4 hour period  . Measure blood pressure    20 minutes after giving hydralazine 10 MG IV dose.  Call MD if SBP >/= 160 or DBP >/= 110.    Standing Status:   Standing    Number of Occurrences:   1   Patient Vitals for the past 24 hrs:  BP Temp Temp src Pulse Resp SpO2 Height Weight  01/07/20 2033 138/75 -- -- 78 -- -- -- --  01/07/20 2016 (!) 131/49 98.3 F (36.8 C) Oral 80 16 -- -- --  01/07/20 2001 (!) 141/63 -- -- 73 -- -- -- --  01/07/20 1946 (!) 144/74 -- -- 69 -- -- -- --  01/07/20 1931 137/62 -- -- 77 -- -- -- --  01/07/20 1916 (!) 152/84 -- -- 82 -- -- -- --  01/07/20 1900 (!) 161/94 -- -- 76 -- -- -- --  01/07/20 1846 (!) 167/104 -- -- 91 --  -- -- --  01/07/20 1831 (!) 144/86 -- -- 70 -- -- -- --  01/07/20 1812 (!) 148/88 -- Oral  81 18 100 % 5\' 5"  (1.651 m) (!) 136.5 kg    Results for orders placed or performed during the hospital encounter of 01/07/20 (from the past 24 hour(s))  CBC     Status: None   Collection Time: 01/07/20  6:50 PM  Result Value Ref Range   WBC 10.3 4.0 - 10.5 K/uL   RBC 4.32 3.87 - 5.11 MIL/uL   Hemoglobin 12.6 12.0 - 15.0 g/dL   HCT 01/09/20 25.8 - 52.7 %   MCV 90.3 80.0 - 100.0 fL   MCH 29.2 26.0 - 34.0 pg   MCHC 32.3 30.0 - 36.0 g/dL   RDW 78.2 42.3 - 53.6 %   Platelets 255 150 - 400 K/uL   nRBC 0.0 0.0 - 0.2 %  Comprehensive metabolic panel     Status: Abnormal   Collection Time: 01/07/20  6:50 PM  Result Value Ref Range   Sodium 138 135 - 145 mmol/L   Potassium 3.7 3.5 - 5.1 mmol/L   Chloride 107 98 - 111 mmol/L   CO2 23 22 - 32 mmol/L   Glucose, Bld 79 70 - 99 mg/dL   BUN 6 6 - 20 mg/dL   Creatinine, Ser 01/09/20 0.44 - 1.00 mg/dL   Calcium 8.7 (L) 8.9 - 10.3 mg/dL   Total Protein 5.7 (L) 6.5 - 8.1 g/dL   Albumin 2.6 (L) 3.5 - 5.0 g/dL   AST 14 (L) 15 - 41 U/L   ALT 11 0 - 44 U/L   Alkaline Phosphatase 118 38 - 126 U/L   Total Bilirubin 0.1 (L) 0.3 - 1.2 mg/dL   GFR calc non Af Amer >60 >60 mL/min   GFR calc Af Amer >60 >60 mL/min   Anion gap 8 5 - 15  Protein / creatinine ratio, urine     Status: None   Collection Time: 01/07/20  6:55 PM  Result Value Ref Range   Creatinine, Urine 251.31 mg/dL   Total Protein, Urine 11 mg/dL   Protein Creatinine Ratio 0.04 0.00 - 0.15 mg/mg[Cre]   Meds ordered this encounter  Medications  . metoCLOPramide (REGLAN) injection 10 mg  . diphenhydrAMINE (BENADRYL) injection 25 mg  . dexamethasone (DECADRON) injection 10 mg  . lactated ringers infusion  . labetalol (NORMODYNE) 100 MG tablet    Sig: Take 1 tablet (100 mg total) by mouth 2 (two) times daily.    Dispense:  30 tablet    Refill:  3    Order Specific Question:   Supervising Provider     Answer:   01/09/20 [3804]  . labetalol (NORMODYNE) tablet 100 mg   Assessment and Plan  --29 y.o. G1P0000 at [redacted]w[redacted]d  --Reactive tracing -gHTN, normal PEC labs --Discharge planning including rx per verbal orders from Dr. [redacted]w[redacted]d --Discharge home in stable condition with strict return precautions  F/U: --Blood pressure check Thursday 03/18  4/18, MSN, CNM Certified Nurse Midwife, Faculty [facility] 01/07/20 9:51 PM

## 2020-01-07 NOTE — MAU Note (Signed)
Woke up with a HA, took a Tylenol, didn't touch it, took Flexeril and more Tylenol, still no relief.  Feet are swollen, very badly, hands are also swollen.  Went to DR because meds weren't working.  BP 148/80, sent over for further eval.  sensitive to light, no visual changes, denies epigastric pain. Has been nauseated and dizzy.

## 2020-01-07 NOTE — Discharge Instructions (Signed)

## 2020-01-14 ENCOUNTER — Ambulatory Visit: Payer: Self-pay | Admitting: Family Medicine

## 2020-01-16 ENCOUNTER — Inpatient Hospital Stay (HOSPITAL_BASED_OUTPATIENT_CLINIC_OR_DEPARTMENT_OTHER): Payer: BC Managed Care – PPO

## 2020-01-16 ENCOUNTER — Inpatient Hospital Stay (HOSPITAL_COMMUNITY)
Admission: AD | Admit: 2020-01-16 | Discharge: 2020-01-16 | Disposition: A | Discharge status: Home / Self Care | Payer: BC Managed Care – PPO | Attending: Obstetrics | Admitting: Obstetrics

## 2020-01-16 ENCOUNTER — Other Ambulatory Visit: Payer: Self-pay

## 2020-01-16 ENCOUNTER — Encounter (HOSPITAL_COMMUNITY): Payer: Self-pay | Admitting: Obstetrics

## 2020-01-16 DIAGNOSIS — O133 Gestational [pregnancy-induced] hypertension without significant proteinuria, third trimester: Secondary | ICD-10-CM

## 2020-01-16 DIAGNOSIS — O288 Other abnormal findings on antenatal screening of mother: Secondary | ICD-10-CM | POA: Diagnosis not present

## 2020-01-16 DIAGNOSIS — O4103X0 Oligohydramnios, third trimester, not applicable or unspecified: Secondary | ICD-10-CM

## 2020-01-16 DIAGNOSIS — Z3A34 34 weeks gestation of pregnancy: Secondary | ICD-10-CM

## 2020-01-16 DIAGNOSIS — O99213 Obesity complicating pregnancy, third trimester: Secondary | ICD-10-CM | POA: Diagnosis not present

## 2020-01-16 LAB — COMPREHENSIVE METABOLIC PANEL
ALT: 15 U/L (ref 0–44)
AST: 18 U/L (ref 15–41)
Albumin: 2.6 g/dL — ABNORMAL LOW (ref 3.5–5.0)
Alkaline Phosphatase: 137 U/L — ABNORMAL HIGH (ref 38–126)
Anion gap: 10 (ref 5–15)
BUN: 12 mg/dL (ref 6–20)
CO2: 21 mmol/L — ABNORMAL LOW (ref 22–32)
Calcium: 9.2 mg/dL (ref 8.9–10.3)
Chloride: 107 mmol/L (ref 98–111)
Creatinine, Ser: 0.58 mg/dL (ref 0.44–1.00)
GFR calc Af Amer: >60 mL/min (ref >60)
GFR calc non Af Amer: >60 mL/min (ref >60)
Glucose, Bld: 96 mg/dL (ref 70–99)
Potassium: 4.3 mmol/L (ref 3.5–5.1)
Sodium: 138 mmol/L (ref 135–145)
Total Bilirubin: 0.2 mg/dL — ABNORMAL LOW (ref 0.3–1.2)
Total Protein: 5.7 g/dL — ABNORMAL LOW (ref 6.5–8.1)

## 2020-01-16 LAB — URINALYSIS, ROUTINE W REFLEX MICROSCOPIC
Bilirubin Urine: NEGATIVE
Glucose, UA: NEGATIVE mg/dL
Hgb urine dipstick: NEGATIVE
Ketones, ur: NEGATIVE mg/dL
Nitrite: NEGATIVE
Protein, ur: 100 mg/dL — AB
Specific Gravity, Urine: 1.033 — ABNORMAL HIGH (ref 1.005–1.030)
pH: 5 (ref 5.0–8.0)

## 2020-01-16 LAB — PROTEIN / CREATININE RATIO, URINE
Creatinine, Urine: 303.43 mg/dL
Protein Creatinine Ratio: 0.17 mg/mg{Cre} — ABNORMAL HIGH (ref 0.00–0.15)
Total Protein, Urine: 51 mg/dL

## 2020-01-16 LAB — CBC
HCT: 39 % (ref 36.0–46.0)
Hemoglobin: 12.5 g/dL (ref 12.0–15.0)
MCH: 29.1 pg (ref 26.0–34.0)
MCHC: 32.1 g/dL (ref 30.0–36.0)
MCV: 90.9 fL (ref 80.0–100.0)
Platelets: 265 10*3/uL (ref 150–400)
RBC: 4.29 MIL/uL (ref 3.87–5.11)
RDW: 13.8 % (ref 11.5–15.5)
WBC: 10.9 10*3/uL — ABNORMAL HIGH (ref 4.0–10.5)
nRBC: 0 % (ref 0.0–0.2)

## 2020-01-16 NOTE — MAU Provider Note (Addendum)
History     CSN: [id number]  Arrival date and time: 01/16/20 1728   First Provider Initiated Contact with Patient 01/16/20 1834     CC: Comes from the office for additional monitoring - was having variables in the office  HPI Tammy Howard 29 y.o. [redacted]w[redacted]d On arrival today, client had one elevated BP in the severe range, and then came down to 159/101 She has gestational hypertension and has taken Labatelol 100 mg BID for about 2 weeks.  Yesterday, her dose was changed to 200 mg BID.  Has not eaten since 1:30 pm today.  Denies any gestational diabetes.  Goes for [facility] care to [facility].   OB History    Gravida  1   Para  0   Term  0   Preterm  0   AB  0   Living  0     SAB  0   TAB  0   Ectopic  0   Multiple  0   Live Births  0           Past Medical History:  Diagnosis Date  . Hernia, epigastric   . Migraine with aura     Past Surgical History:  Procedure Laterality Date  . EYE SURGERY      Family History  Problem Relation Age of Onset  . Chiari malformation Paternal Great-grandmother   . Meniere's disease Mother   . COPD Father     Social History   Tobacco Use  . Smoking status: Never Smoker  . Smokeless tobacco: Never Used  Substance Use Topics  . Alcohol use: Not Currently    Comment: occ  . Drug use: Not Currently    Types: Marijuana    Comment: last smoked 2020    Allergies:  Allergies  Allergen Reactions  . Other Anaphylaxis  . Bee Venom Swelling    Medications Prior to Admission  Medication Sig Dispense Refill Last Dose  . cyclobenzaprine (FLEXERIL) 10 MG tablet Take 1 tablet (10 mg total) by mouth 3 (three) times daily as needed for muscle spasms. 20 tablet 0 Past Month at Unknown time  . labetalol (NORMODYNE) 100 MG tablet Take 1 tablet (100 mg total) by mouth 2 (two) times daily. (Patient taking differently: Take 200 mg by mouth 2 (two) times daily. ) 30 tablet 3 01/16/2020 at Unknown time  . Prenatal Vit-Fe  Fumarate-FA (PRENATAL MULTIVITAMIN) TABS tablet Take 1 tablet by mouth daily at 12 noon.   01/16/2020 at Unknown time  . metoCLOPramide (REGLAN) 10 MG tablet Take 1 tablet (10 mg total) by mouth every 8 (eight) hours as needed for nausea (or headache, take with tylenol for headache). 30 tablet 0  at not taking  . metroNIDAZOLE (METROGEL) 0.75 % vaginal gel Place vaginally at bedtime.    at not taking    Review of Systems  Constitutional: Negative for fever.  Eyes: Negative for visual disturbance.  Respiratory: Negative for cough and shortness of breath.   Cardiovascular:       Edema but is stable, not new  Gastrointestinal: Negative for abdominal pain, constipation, diarrhea, nausea and vomiting.  Genitourinary: Negative for dysuria, vaginal bleeding and vaginal discharge.  Neurological: Negative for headaches.   Physical Exam   Blood pressure (!) 154/91, pulse 84, temperature 98.4 F (36.9 C), temperature source Oral, resp. rate 16, last menstrual period 05/09/2019, SpO2 99 %.  Physical Exam  Nursing note and vitals reviewed. Constitutional: She is oriented to person, place,  and time. She appears well-developed.  obesity  HENT:  Head: Normocephalic.  Eyes: EOM are normal.  Cardiovascular: Normal rate.  Respiratory: Effort normal.  GI: Soft. There is no abdominal tenderness. There is no rebound and no guarding.  Musculoskeletal:        General: Normal range of motion.     Cervical back: Neck supple.  Neurological: She is alert and oriented to person, place, and time.  Skin: Skin is warm and dry.  Psychiatric: She has a normal mood and affect.   Vitals:   01/16/20 1816 01/16/20 1831 01/16/20 1846 01/16/20 1900  BP: (!) 163/90 (!) 150/77 (!) 154/91 (!) 149/85  Pulse: 77 76 84 84  Resp:      Temp:      TempSrc:      SpO2:       Vitals:   01/16/20 2125 01/16/20 2130 01/16/20 2131 01/16/20 2149  BP:   (!) 155/81 (!) 155/81  Pulse:   76   Resp:      Temp:      TempSrc:       SpO2: 97% 98%     Her night time dose of antihypertensive med is due  MAU Course  Procedures  MDM With serial BPs, pressure has been elevated but not severe range.  FHT baseline had no accelerations, so meal tray was given to client to eat. FHT monitor - one deceleration to 90 - position change and within approx, 2 minutes resolved to baseline.  Even after eating, strip was not reactive, so client went for BPP.  Report given to M. Mayford Knife, CNM who assumed care at 2000.  Assessment and Plan  Gestational hypertension being treated with labatelol 200 mg BID  Plan   Terri L Burleson 01/16/2020, 7:05 PM      BPP is 8/8, 8/10 (with NR NST, which is borderline reactive at end of tracing) AFI is 4.75 Dr Marice Potter consulted and she looked at entire [facility] tracing.  End of tracing shows more variability. She states findings are sufficient for discharge home  A:  Single IUP at [redacted]w[redacted]d       Equivocal nonstress test, non reactive by criteria       Oligohydramnios  P:  Discharge home       Instructed to call office in AM to inform them she was here       We will forward notes to office       Patient will take her Labetalol when she gets home       Strict fetal movement instructions given, not to wait but to come in if decreased       Encouraged to return here or to other Urgent Care/ED if she develops worsening of symptoms, increase in pain, fever, or other concerning symptoms.    Aviva Signs, CNM

## 2020-01-16 NOTE — Discharge Instructions (Signed)
Biophysical Profile A biophysical profile is a non-invasive test that may be done during pregnancy to check that your developing baby (fetus) and your placenta are healthy. Your health care provider may recommend a biophysical profile if your pregnancy is at a higher risk for certain problems. A biophysical profile is usually done during the last 3 months of pregnancy (third trimester). A biophysical profile combines two tests to check the health of your baby. In one test, you will have a device strapped to your belly to measure your baby's heart rate. The other test involves using sound waves and a computer (ultrasound) to create an image of your baby inside your womb (uterus). Together, these tests tell your health care provider about the overall health of your baby. Tell a health care provider about:  Any allergies you have.  All medicines you are taking, including vitamins, herbs, eye drops, creams, and over-the-counter medicines.  Any medical conditions you have.  Any concerns you have about your pregnancy.  Any symptoms such as abdominal pain or contractions, nausea or vomiting, vaginal bleeding, leaking of amniotic fluid, decreased fetal movements, fever or infection, increased swelling, headaches, or visual disturbances.  How often you feel your baby move. What are the risks? There are no risks to you or your baby from a biophysical profile. What happens before the procedure? Ask your health care provider how to prepare.  You may need to drink fluids so that you have a full bladder for your ultrasound.  You may also need to eat before you arrive for the test. That makes your baby more active. What happens during the procedure?      You will lie on your back on an exam table.  Your blood pressure may be monitored during the procedure.  A belt will be placed around your belly. The belt has a sensor to measure your baby's heart rate.  You may have to wear another belt and  sensor to measure any muscle movements (contractions) in your uterus. During the ultrasound, a health care provider or technician will gently roll a handheld device (transducer) over your belly. This device sends signals to a computer that creates images of your baby.  Five areas of your baby's health and development will be checked during the biophysical profile: ? Heart rate. ? Breathing. ? Movement. ? Active muscle movement (muscle tone). ? The amount of fluid in your uterus (amniotic fluid). The procedure may vary among health care providers and hospitals. What happens after the procedure?  Your health care provider will discuss your results with you. The results of a biophysical profile are scored in a range of 0 to 10. Each area that is evaluated is given a score of 0 or 2 points. If you get a score of 6 or less, you may need further testing, or your baby may need to be delivered early. A score of 8 to 10 with normal amniotic fluid levels is considered normal.  Unless you need additional testing, you can go home right after the procedure and resume your normal activities. Summary  A biophysical profile is a non-invasive test that may be done during pregnancy to check that your developing baby (fetus) and your placenta are healthy.  A biophysical profile combines two tests: A test to measure your baby's heart rate and an ultrasound test to create an image of your baby in the womb.  Tell your health care provider about any concerns you have about your pregnancy or any pregnancy-related symptoms.  If you get a score of 6 or less, you may need further testing, or your baby may need to be delivered early. A score of 8 to 10 with normal amniotic fluid levels is considered normal. This information is not intended to replace advice given to you by your health care provider. Make sure you discuss any questions you have with your health care provider. Document Revised: 01/30/2019 Document  Reviewed: 11/11/2016 Elsevier Patient Education  2020 [employer]. Oligohydramnios Oligohydramnios is a condition in which there is not enough fluid in the sac (amniotic sac) that surrounds your unborn baby (fetus) in the uterus. The amniotic sac contains fluid (amniotic fluid) that:  Protects your baby from injury (trauma) and infections.  Helps your baby move freely inside the uterus.  Helps your baby's lungs, kidneys, and digestive system to develop. This condition can interfere with your baby's normal prenatal growth and development. It can happen any time during pregnancy but is most common during the last 3 months (third trimester). Oligohydramnios is most likely to cause serious problems when it occurs early in pregnancy. Some problems that can result from this condition include:  Early (premature) birth.  Birth defects.  Limited (restricted) fetal growth.  Decreased oxygen flow to the fetus due to pressure on the umbilical cord.  Pregnancy loss (miscarriage).  Stillbirth. What are the causes? This condition may be caused by:  A leak or tear in the amniotic sac.  A problem with the organ that nourishes the baby in the uterus (placenta), such as failure of the placenta to provide enough blood, fluid, and nutrients to the baby.  Having identical twins who share the same placenta.  A fetal birth defect. This is usually an abnormality in the fetal kidneys or urinary tract.  A pregnancy that goes 2 weeks or more past the due date.  A condition that the mother has, such as: ? A lack of fluids in the body (dehydration). ? High blood pressure. ? Diabetes. ? Reactions to certain medicines, such as ibuprofen or blood pressure medicines (ACE inhibitors). ? Systemic lupus. In some cases, the cause is not known. What increases the risk? This condition is more likely to develop if you:  Become dehydrated.  Have high blood pressure.  Take NSAIDs or ACE inhibitors.  Have  diabetes or lupus.  Have poor prenatal care.  Have a clotting disorder. What are the signs or symptoms? In most cases, there are no symptoms of oligohydramnios. If you do have symptoms, they may include:  Fluid leaking from the vagina.  Having a uterus that is smaller than normal.  Feeling less movement of your baby in your uterus. How is this diagnosed? This condition may be diagnosed by measuring the amount of amniotic fluid in your amniotic sac using the amniotic fluid index (AFI). The AFI is a measurement that is done during a prenatal ultrasound test that uses painless, harmless sound waves to create an image of your uterus and your baby. This prenatal ultrasound may be done to:  Measure the amniotic fluid level.  Check your baby's kidneys and your baby's growth.  Evaluate the placenta. You may also have other tests to find the cause of oligohydramnios. How is this treated? Treatment for this condition depends on how low your amniotic fluid level is, how far along you are in your pregnancy, and your overall health. Treatment may include:  Having your health care provider monitor your condition more closely than usual. You may have more frequent appointments  and more AFI ultrasound measurements.  Increasing the amount of fluid in your body. This may be done by having you drink more fluids or by giving you fluids through an IV that is inserted into one of your veins.  Injecting fluid into your amniotic sac during delivery (amnioinfusion).  Having your baby delivered early, if you are close to your due date. Follow these instructions at home: Lifestyle  Do not drink alcohol. No amount of alcohol is safe during pregnancy.  Do not use any products that contain nicotine or tobacco, such as cigarettes, e-cigarettes, and chewing tobacco. If you need help quitting, ask your health care provider.  Do not use any illegal drugs. These can harm your developing baby or cause a  miscarriage. General instructions      Take over-the-counter and prescription medicines only as told by your health care provider.  Follow instructions from your health care provider about physical activity and rest. Your health care provider may recommend that you stay in bed (be on bed rest).  Follow instructions from your health care provider about eating or drinking restrictions.  Eat healthy foods, including a balance of fruits and vegetables, lean proteins, whole grains, and low-fat or nonfat dairy products.  Drink enough fluid to keep your urine pale yellow.  Keep all prenatal care appointments with your health care provider. This is important. Contact a health care provider if:  You notice that your baby seems to be moving less than usual. Get help right away if:  You have fluid leaking from your vagina.  You start to have labor pains (contractions). This may feel like a sense of tightening in your lower abdomen.  You have a fever. Summary  Oligohydramnios is a condition in which there is not enough fluid in the amniotic sac that surrounds your unborn baby in the uterus. It can interfere with your baby's normal growth and development.  This condition is most common in the last 3 months of pregnancy but can occur at any time. If it occurs early in pregnancy, oligohydramnios can lead to serious problems.  Do not drink alcohol or use nicotine, tobacco, or illegal drugs.  Keep your regular prenatal appointments, eat healthy, and drink enough fluid to keep your urine pale yellow.  Contact your health care provider if you notice that your baby seems to be moving less than usual. Get help right away if you have fluid leaking from your vagina, you sense a tightening in your lower abdomen, or you have a fever. This information is not intended to replace advice given to you by your health care provider. Make sure you discuss any questions you have with your health care  provider. Document Revised: 03/21/2018 Document Reviewed: 03/21/2018 Elsevier Patient Education  2020 [employer].  Third Trimester of Pregnancy The third trimester is from week 28 through week 40 (months 7 through 9). The third trimester is a time when the unborn baby (fetus) is growing rapidly. At the end of the ninth month, the fetus is about 20 inches in length and weighs 6-10 pounds. Body changes during your third trimester Your body will continue to go through many changes during pregnancy. The changes vary from woman to woman. During the third trimester:  Your weight will continue to increase. You can expect to gain 25-35 pounds (11-16 kg) by the end of the pregnancy.  You may begin to get stretch marks on your hips, abdomen, and breasts.  You may urinate more often because the fetus  is moving lower into your pelvis and pressing on your bladder.  You may develop or continue to have heartburn. This is caused by increased hormones that slow down muscles in the digestive tract.  You may develop or continue to have constipation because increased hormones slow digestion and cause the muscles that push waste through your intestines to relax.  You may develop hemorrhoids. These are swollen veins (varicose veins) in the rectum that can itch or be painful.  You may develop swollen, bulging veins (varicose veins) in your legs.  You may have increased body aches in the pelvis, back, or thighs. This is due to weight gain and increased hormones that are relaxing your joints.  You may have changes in your hair. These can include thickening of your hair, rapid growth, and changes in texture. Some women also have hair loss during or after pregnancy, or hair that feels dry or thin. Your hair will most likely return to normal after your baby is born.  Your breasts will continue to grow and they will continue to become tender. A yellow fluid (colostrum) may leak from your breasts. This is the first  milk you are producing for your baby.  Your belly button may stick out.  You may notice more swelling in your hands, face, or ankles.  You may have increased tingling or numbness in your hands, arms, and legs. The skin on your belly may also feel numb.  You may feel short of breath because of your expanding uterus.  You may have more problems sleeping. This can be caused by the size of your belly, increased need to urinate, and an increase in your body's metabolism.  You may notice the fetus "dropping," or moving lower in your abdomen (lightening).  You may have increased vaginal discharge.  You may notice your joints feel loose and you may have pain around your pelvic bone. What to expect at prenatal visits You will have prenatal exams every 2 weeks until week 36. Then you will have weekly prenatal exams. During a routine prenatal visit:  You will be weighed to make sure you and the baby are growing normally.  Your blood pressure will be taken.  Your abdomen will be measured to track your baby's growth.  The fetal heartbeat will be listened to.  Any test results from the previous visit will be discussed.  You may have a cervical check near your due date to see if your cervix has softened or thinned (effaced).  You will be tested for Group B streptococcus. This happens between 35 and 37 weeks. Your health care provider may ask you:  What your birth plan is.  How you are feeling.  If you are feeling the baby move.  If you have had any abnormal symptoms, such as leaking fluid, bleeding, severe headaches, or abdominal cramping.  If you are using any tobacco products, including cigarettes, chewing tobacco, and electronic cigarettes.  If you have any questions. Other tests or screenings that may be performed during your third trimester include:  Blood tests that check for low iron levels (anemia).  Fetal testing to check the health, activity level, and growth of the  fetus. Testing is done if you have certain medical conditions or if there are problems during the pregnancy.  Nonstress test (NST). This test checks the health of your baby to make sure there are no signs of problems, such as the baby not getting enough oxygen. During this test, a belt is placed around  your belly. The baby is made to move, and its heart rate is monitored during movement. What is false labor? False labor is a condition in which you feel small, irregular tightenings of the muscles in the womb (contractions) that usually go away with rest, changing position, or drinking water. These are called Braxton Hicks contractions. Contractions may last for hours, days, or even weeks before true labor sets in. If contractions come at regular intervals, become more frequent, increase in intensity, or become painful, you should see your health care provider. What are the signs of labor?  Abdominal cramps.  Regular contractions that start at 10 minutes apart and become stronger and more frequent with time.  Contractions that start on the top of the uterus and spread down to the lower abdomen and back.  Increased pelvic pressure and dull back pain.  A watery or bloody mucus discharge that comes from the vagina.  Leaking of amniotic fluid. This is also known as your "water breaking." It could be a slow trickle or a gush. Let your health care provider know if it has a color or strange odor. If you have any of these signs, call your health care provider right away, even if it is before your due date. Follow these instructions at home: Medicines  Follow your health care provider's instructions regarding medicine use. Specific medicines may be either safe or unsafe to take during pregnancy.  Take a prenatal vitamin that contains at least 600 micrograms (mcg) of folic acid.  If you develop constipation, try taking a stool softener if your health care provider approves. Eating and  drinking   Eat a balanced diet that includes fresh fruits and vegetables, whole grains, good sources of protein such as meat, eggs, or tofu, and low-fat dairy. Your health care provider will help you determine the amount of weight gain that is right for you.  Avoid raw meat and uncooked cheese. These carry germs that can cause birth defects in the baby.  If you have low calcium intake from food, talk to your health care provider about whether you should take a daily calcium supplement.  Eat four or five small meals rather than three large meals a day.  Limit foods that are high in fat and processed sugars, such as fried and sweet foods.  To prevent constipation: ? Drink enough fluid to keep your urine clear or pale yellow. ? Eat foods that are high in fiber, such as fresh fruits and vegetables, whole grains, and beans. Activity  Exercise only as directed by your health care provider. Most women can continue their usual exercise routine during pregnancy. Try to exercise for 30 minutes at least 5 days a week. Stop exercising if you experience uterine contractions.  Avoid heavy lifting.  Do not exercise in extreme heat or humidity, or at high altitudes.  Wear low-heel, comfortable shoes.  Practice good posture.  You may continue to have sex unless your health care provider tells you otherwise. Relieving pain and discomfort  Take frequent breaks and rest with your legs elevated if you have leg cramps or low back pain.  Take warm sitz baths to soothe any pain or discomfort caused by hemorrhoids. Use hemorrhoid cream if your health care provider approves.  Wear a good support bra to prevent discomfort from breast tenderness.  If you develop varicose veins: ? Wear support pantyhose or compression stockings as told by your healthcare provider. ? Elevate your feet for 15 minutes, 3-4 times a day. Prenatal  care  Write down your questions. Take them to your prenatal visits.  Keep all  your prenatal visits as told by your health care provider. This is important. Safety  Wear your seat belt at all times when driving.  Make a list of emergency phone numbers, including numbers for family, friends, the hospital, and police and fire departments. General instructions  Avoid cat litter boxes and soil used by cats. These carry germs that can cause birth defects in the baby. If you have a cat, ask someone to clean the litter box for you.  Do not travel far distances unless it is absolutely necessary and only with the approval of your health care provider.  Do not use hot tubs, steam rooms, or saunas.  Do not drink alcohol.  Do not use any products that contain nicotine or tobacco, such as cigarettes and e-cigarettes. If you need help quitting, ask your health care provider.  Do not use any medicinal herbs or unprescribed drugs. These chemicals affect the formation and growth of the baby.  Do not douche or use tampons or scented sanitary pads.  Do not cross your legs for long periods of time.  To prepare for the arrival of your baby: ? Take prenatal classes to understand, practice, and ask questions about labor and delivery. ? Make a trial run to the hospital. ? Visit the hospital and tour the maternity area. ? Arrange for maternity or paternity leave through employers. ? Arrange for family and friends to take care of pets while you are in the hospital. ? Purchase a rear-facing car seat and make sure you know how to install it in your car. ? Pack your hospital bag. ? Prepare the baby's nursery. Make sure to remove all pillows and stuffed animals from the baby's crib to prevent suffocation.  Visit your dentist if you have not gone during your pregnancy. Use a soft toothbrush to brush your teeth and be gentle when you floss. Contact a health care provider if:  You are unsure if you are in labor or if your water has broken.  You become dizzy.  You have mild pelvic  cramps, pelvic pressure, or nagging pain in your abdominal area.  You have lower back pain.  You have persistent nausea, vomiting, or diarrhea.  You have an unusual or bad smelling vaginal discharge.  You have pain when you urinate. Get help right away if:  Your water breaks before 37 weeks.  You have regular contractions less than 5 minutes apart before 37 weeks.  You have a fever.  You are leaking fluid from your vagina.  You have spotting or bleeding from your vagina.  You have severe abdominal pain or cramping.  You have rapid weight loss or weight gain.  You have shortness of breath with chest pain.  You notice sudden or extreme swelling of your face, hands, ankles, feet, or legs.  Your baby makes fewer than 10 movements in 2 hours.  You have severe headaches that do not go away when you take medicine.  You have vision changes. Summary  The third trimester is from week 28 through week 40, months 7 through 9. The third trimester is a time when the unborn baby (fetus) is growing rapidly.  During the third trimester, your discomfort may increase as you and your baby continue to gain weight. You may have abdominal, leg, and back pain, sleeping problems, and an increased need to urinate.  During the third trimester your breasts will keep  growing and they will continue to become tender. A yellow fluid (colostrum) may leak from your breasts. This is the first milk you are producing for your baby.  False labor is a condition in which you feel small, irregular tightenings of the muscles in the womb (contractions) that eventually go away. These are called Braxton Hicks contractions. Contractions may last for hours, days, or even weeks before true labor sets in.  Signs of labor can include: abdominal cramps; regular contractions that start at 10 minutes apart and become stronger and more frequent with time; watery or bloody mucus discharge that comes from the vagina; increased  pelvic pressure and dull back pain; and leaking of amniotic fluid. This information is not intended to replace advice given to you by your health care provider. Make sure you discuss any questions you have with your health care provider. Document Revised: 01/31/2019 Document Reviewed: 11/15/2016 Elsevier Patient Education  2020 [employer].

## 2020-01-17 LAB — CULTURE, OB URINE
Culture: <10000 — AB
Special Requests: NORMAL

## 2020-01-20 ENCOUNTER — Other Ambulatory Visit: Payer: Self-pay

## 2020-01-20 ENCOUNTER — Encounter (HOSPITAL_COMMUNITY)
Admission: AD | Disposition: A | Discharge status: Home / Self Care | Payer: Self-pay | Source: Home / Self Care | Attending: Obstetrics and Gynecology

## 2020-01-20 ENCOUNTER — Inpatient Hospital Stay (HOSPITAL_COMMUNITY)
Admission: AD | Admit: 2020-01-20 | Discharge: 2020-01-25 | DRG: 788 | Disposition: A | Discharge status: Home / Self Care | Payer: BC Managed Care – PPO | Attending: Obstetrics and Gynecology | Admitting: Obstetrics and Gynecology

## 2020-01-20 ENCOUNTER — Inpatient Hospital Stay (HOSPITAL_COMMUNITY): Payer: BC Managed Care – PPO | Admitting: Certified Registered Nurse Anesthetist

## 2020-01-20 DIAGNOSIS — O288 Other abnormal findings on antenatal screening of mother: Secondary | ICD-10-CM

## 2020-01-20 DIAGNOSIS — O872 Hemorrhoids in the puerperium: Secondary | ICD-10-CM | POA: Diagnosis not present

## 2020-01-20 DIAGNOSIS — Z6791 Unspecified blood type, Rh negative: Secondary | ICD-10-CM | POA: Diagnosis not present

## 2020-01-20 DIAGNOSIS — O368390 Maternal care for abnormalities of the fetal heart rate or rhythm, unspecified trimester, not applicable or unspecified: Secondary | ICD-10-CM | POA: Diagnosis not present

## 2020-01-20 DIAGNOSIS — Z6841 Body Mass Index (BMI) 40.0 and over, adult: Secondary | ICD-10-CM

## 2020-01-20 DIAGNOSIS — F329 Major depressive disorder, single episode, unspecified: Secondary | ICD-10-CM | POA: Diagnosis present

## 2020-01-20 DIAGNOSIS — O99344 Other mental disorders complicating childbirth: Secondary | ICD-10-CM | POA: Diagnosis present

## 2020-01-20 DIAGNOSIS — Z3A34 34 weeks gestation of pregnancy: Secondary | ICD-10-CM

## 2020-01-20 DIAGNOSIS — F419 Anxiety disorder, unspecified: Secondary | ICD-10-CM | POA: Diagnosis present

## 2020-01-20 DIAGNOSIS — O1410 Severe pre-eclampsia, unspecified trimester: Secondary | ICD-10-CM | POA: Diagnosis present

## 2020-01-20 DIAGNOSIS — O1414 Severe pre-eclampsia complicating childbirth: Secondary | ICD-10-CM | POA: Diagnosis present

## 2020-01-20 DIAGNOSIS — O133 Gestational [pregnancy-induced] hypertension without significant proteinuria, third trimester: Secondary | ICD-10-CM

## 2020-01-20 DIAGNOSIS — O99892 Other specified diseases and conditions complicating childbirth: Secondary | ICD-10-CM | POA: Diagnosis present

## 2020-01-20 DIAGNOSIS — O99214 Obesity complicating childbirth: Secondary | ICD-10-CM | POA: Diagnosis present

## 2020-01-20 DIAGNOSIS — O26893 Other specified pregnancy related conditions, third trimester: Secondary | ICD-10-CM | POA: Diagnosis present

## 2020-01-20 DIAGNOSIS — Z20822 Contact with and (suspected) exposure to covid-19: Secondary | ICD-10-CM | POA: Diagnosis present

## 2020-01-20 HISTORY — DX: Maternal care for abnormalities of the fetal heart rate or rhythm, unspecified trimester, not applicable or unspecified: O36.8390

## 2020-01-20 LAB — CBC
HCT: 37.2 % (ref 36.0–46.0)
Hemoglobin: 12.2 g/dL (ref 12.0–15.0)
MCH: 30 pg (ref 26.0–34.0)
MCHC: 32.8 g/dL (ref 30.0–36.0)
MCV: 91.4 fL (ref 80.0–100.0)
Platelets: 232 10*3/uL (ref 150–400)
RBC: 4.07 MIL/uL (ref 3.87–5.11)
RDW: 13.9 % (ref 11.5–15.5)
WBC: 12.7 10*3/uL — ABNORMAL HIGH (ref 4.0–10.5)
nRBC: 0 % (ref 0.0–0.2)

## 2020-01-20 LAB — COMPREHENSIVE METABOLIC PANEL
ALT: 34 U/L (ref 0–44)
AST: 26 U/L (ref 15–41)
Albumin: 2.5 g/dL — ABNORMAL LOW (ref 3.5–5.0)
Alkaline Phosphatase: 125 U/L (ref 38–126)
Anion gap: 8 (ref 5–15)
BUN: 11 mg/dL (ref 6–20)
CO2: 21 mmol/L — ABNORMAL LOW (ref 22–32)
Calcium: 8.5 mg/dL — ABNORMAL LOW (ref 8.9–10.3)
Chloride: 109 mmol/L (ref 98–111)
Creatinine, Ser: 0.51 mg/dL (ref 0.44–1.00)
GFR calc Af Amer: >60 mL/min (ref >60)
GFR calc non Af Amer: >60 mL/min (ref >60)
Glucose, Bld: 81 mg/dL (ref 70–99)
Potassium: 3.8 mmol/L (ref 3.5–5.1)
Sodium: 138 mmol/L (ref 135–145)
Total Bilirubin: 0.4 mg/dL (ref 0.3–1.2)
Total Protein: 5.2 g/dL — ABNORMAL LOW (ref 6.5–8.1)

## 2020-01-20 LAB — URIC ACID: Uric Acid, Serum: 5.7 mg/dL (ref 2.5–7.1)

## 2020-01-20 LAB — TYPE AND SCREEN
ABO/RH(D): A NEG
Antibody Screen: POSITIVE

## 2020-01-20 LAB — RESPIRATORY PANEL BY RT PCR (FLU A&B, COVID)
Influenza A by PCR: NEGATIVE
Influenza B by PCR: NEGATIVE
SARS Coronavirus 2 by RT PCR: NEGATIVE

## 2020-01-20 SURGERY — Surgical Case
Anesthesia: Regional

## 2020-01-20 MED ORDER — LABETALOL HCL 5 MG/ML IV SOLN
INTRAVENOUS | Status: AC
  Administered 2020-01-20: 20 mg via INTRAVENOUS
  Filled 2020-01-20: qty 4

## 2020-01-20 MED ORDER — MORPHINE SULFATE (PF) 0.5 MG/ML IJ SOLN
INTRAMUSCULAR | Status: AC
  Filled 2020-01-20: qty 10

## 2020-01-20 MED ORDER — SOD CITRATE-CITRIC ACID 500-334 MG/5ML PO SOLN
30.0000 mL | Freq: Once | ORAL | Status: AC
  Administered 2020-01-20: 30 mL via ORAL
  Filled 2020-01-20: qty 30

## 2020-01-20 MED ORDER — DOCUSATE SODIUM 100 MG PO CAPS: 100.0000 mg | ORAL_CAPSULE | Freq: Every day | ORAL | Status: DC

## 2020-01-20 MED ORDER — MAGNESIUM SULFATE 40 GM/1000ML IV SOLN
2.0000 g/h | INTRAVENOUS | Status: AC
  Administered 2020-01-20 – 2020-01-21 (×3): 2 g/h via INTRAVENOUS
  Filled 2020-01-20 (×2): qty 1000

## 2020-01-20 MED ORDER — FAMOTIDINE IN NACL 20-0.9 MG/50ML-% IV SOLN
20.0000 mg | Freq: Once | INTRAVENOUS | Status: AC
  Administered 2020-01-20: 20 mg via INTRAVENOUS
  Filled 2020-01-20: qty 50

## 2020-01-20 MED ORDER — PRENATAL MULTIVITAMIN CH: 1.0000 | ORAL_TABLET | Freq: Every day | ORAL | Status: DC

## 2020-01-20 MED ORDER — HYDRALAZINE HCL 10 MG PO TABS
10.0000 mg | ORAL_TABLET | Freq: Four times a day (QID) | ORAL | Status: DC | PRN
  Filled 2020-01-20: qty 1

## 2020-01-20 MED ORDER — BETAMETHASONE SOD PHOS & ACET 6 (3-3) MG/ML IJ SUSP
12.0000 mg | INTRAMUSCULAR | Status: DC
  Filled 2020-01-20: qty 5

## 2020-01-20 MED ORDER — PHENYLEPHRINE HCL-NACL 20-0.9 MG/250ML-% IV SOLN
INTRAVENOUS | Status: DC | PRN
  Administered 2020-01-20: 60 ug/min via INTRAVENOUS

## 2020-01-20 MED ORDER — PHENYLEPHRINE HCL-NACL 20-0.9 MG/250ML-% IV SOLN
INTRAVENOUS | Status: AC
  Filled 2020-01-20: qty 250

## 2020-01-20 MED ORDER — LABETALOL HCL 5 MG/ML IV SOLN
40.0000 mg | INTRAVENOUS | Status: DC | PRN
  Administered 2020-01-20: 40 mg via INTRAVENOUS
  Filled 2020-01-20: qty 8

## 2020-01-20 MED ORDER — LABETALOL HCL 5 MG/ML IV SOLN
80.0000 mg | INTRAVENOUS | Status: DC | PRN
  Administered 2020-01-20: 80 mg via INTRAVENOUS
  Filled 2020-01-20: qty 16

## 2020-01-20 MED ORDER — LACTATED RINGERS IV SOLN: INTRAVENOUS | Status: DC

## 2020-01-20 MED ORDER — MAGNESIUM SULFATE BOLUS VIA INFUSION
4.0000 g | Freq: Once | INTRAVENOUS | Status: AC
  Administered 2020-01-20: 4 g via INTRAVENOUS
  Filled 2020-01-20: qty 1000

## 2020-01-20 MED ORDER — DEXAMETHASONE SODIUM PHOSPHATE 4 MG/ML IJ SOLN
INTRAMUSCULAR | Status: AC
  Filled 2020-01-20: qty 1

## 2020-01-20 MED ORDER — OXYTOCIN 40 UNITS IN NORMAL SALINE INFUSION - SIMPLE MED
INTRAVENOUS | Status: AC
  Filled 2020-01-20: qty 1000

## 2020-01-20 MED ORDER — CALCIUM CARBONATE ANTACID 500 MG PO CHEW: 2.0000 | CHEWABLE_TABLET | ORAL | Status: DC | PRN

## 2020-01-20 MED ORDER — ONDANSETRON HCL 4 MG/2ML IJ SOLN
INTRAMUSCULAR | Status: AC
  Filled 2020-01-20: qty 2

## 2020-01-20 MED ORDER — LABETALOL HCL 5 MG/ML IV SOLN: 20.0000 mg | INTRAVENOUS | Status: DC | PRN

## 2020-01-20 MED ORDER — FENTANYL CITRATE (PF) 100 MCG/2ML IJ SOLN
INTRAMUSCULAR | Status: AC
  Filled 2020-01-20: qty 2

## 2020-01-20 MED ORDER — LACTATED RINGERS IV SOLN: INTRAVENOUS | Status: DC | PRN

## 2020-01-20 MED ORDER — OXYTOCIN 40 UNITS IN NORMAL SALINE INFUSION - SIMPLE MED
INTRAVENOUS | Status: DC | PRN
  Administered 2020-01-20: 40 [IU] via INTRAVENOUS

## 2020-01-20 MED ORDER — ONDANSETRON HCL 4 MG/2ML IJ SOLN
INTRAMUSCULAR | Status: DC | PRN
  Administered 2020-01-20: 4 mg via INTRAVENOUS

## 2020-01-20 MED ORDER — DEXTROSE 5 % IV SOLN: 3.0000 g | INTRAVENOUS | Status: DC

## 2020-01-20 MED ORDER — SODIUM CHLORIDE 0.9 % IV SOLN: INTRAVENOUS | Status: DC | PRN

## 2020-01-20 SURGICAL SUPPLY — 36 items
BENZOIN TINCTURE PRP APPL 2/3 (GAUZE/BANDAGES/DRESSINGS) IMPLANT
CHLORAPREP W/TINT 26ML (MISCELLANEOUS) ×2 IMPLANT
CLAMP CORD UMBIL (MISCELLANEOUS) IMPLANT
CLOTH BEACON ORANGE TIMEOUT ST (SAFETY) ×2 IMPLANT
DRESSING PREVENA PLUS CUSTOM (GAUZE/BANDAGES/DRESSINGS) ×1 IMPLANT
DRSG OPSITE POSTOP 4X10 (GAUZE/BANDAGES/DRESSINGS) ×2 IMPLANT
DRSG PREVENA PLUS CUSTOM (GAUZE/BANDAGES/DRESSINGS) ×2
ELECT REM PT RETURN 9FT ADLT (ELECTROSURGICAL) ×2
ELECTRODE REM PT RTRN 9FT ADLT (ELECTROSURGICAL) ×1 IMPLANT
EXTRACTOR VACUUM KIWI (MISCELLANEOUS) IMPLANT
EXTRACTOR VACUUM M CUP 4 TUBE (SUCTIONS) IMPLANT
GLOVE BIO SURGEON STRL SZ7 (GLOVE) ×2 IMPLANT
GLOVE BIOGEL PI IND STRL 7.0 (GLOVE) ×2 IMPLANT
GLOVE BIOGEL PI INDICATOR 7.0 (GLOVE) ×2
GOWN STRL REUS W/TWL LRG LVL3 (GOWN DISPOSABLE) ×4 IMPLANT
KIT ABG SYR 3ML LUER SLIP (SYRINGE) IMPLANT
NEEDLE HYPO 25X5/8 SAFETYGLIDE (NEEDLE) IMPLANT
NS IRRIG 1000ML POUR BTL (IV SOLUTION) ×2 IMPLANT
PACK C SECTION WH (CUSTOM PROCEDURE TRAY) ×2 IMPLANT
PAD OB MATERNITY 4.3X12.25 (PERSONAL CARE ITEMS) ×2 IMPLANT
RTRCTR C-SECT PINK 25CM LRG (MISCELLANEOUS) IMPLANT
STRIP CLOSURE SKIN 1/2X4 (GAUZE/BANDAGES/DRESSINGS) IMPLANT
SUT MNCRL 0 VIOLET CTX 36 (SUTURE) ×2 IMPLANT
SUT MONOCRYL 0 CTX 36 (SUTURE) ×4
SUT PLAIN 0 NONE (SUTURE) IMPLANT
SUT PLAIN 2 0 (SUTURE)
SUT PLAIN ABS 2-0 CT1 27XMFL (SUTURE) IMPLANT
SUT VIC AB 0 CT1 27 (SUTURE) ×4
SUT VIC AB 0 CT1 27XBRD ANBCTR (SUTURE) ×2 IMPLANT
SUT VIC AB 2-0 CT1 27 (SUTURE) ×2
SUT VIC AB 2-0 CT1 TAPERPNT 27 (SUTURE) ×1 IMPLANT
SUT VIC AB 4-0 KS 27 (SUTURE) ×2 IMPLANT
SUT VICRYL 0 TIES 12 18 (SUTURE) IMPLANT
TOWEL OR 17X24 6PK STRL BLUE (TOWEL DISPOSABLE) ×2 IMPLANT
TRAY FOLEY W/BAG SLVR 14FR LF (SET/KITS/TRAYS/PACK) IMPLANT
WATER STERILE IRR 1000ML POUR (IV SOLUTION) ×2 IMPLANT

## 2020-01-20 NOTE — MAU Note (Signed)
Pt sent from [facility] office stating she had variables on her NST today. Pt is also reporting contractions that started earlier this morning. Pt states she has not timed but has had 4 contractions since she has been in the lobby. Pt denies LOF or vaginal bleeding. Reports decreased fetal movement over the past week, but has felt a decent amount since her dr appointment.

## 2020-01-20 NOTE — MAU Provider Note (Signed)
First Provider Initiated Contact with Patient 01/20/20 2126      S Ms. Tammy Howard is a 29 y.o. G1P0000 pregnant female [redacted]w[redacted]d who presents to MAU today with complaint of fetal monitoring. Patient reports she was seen in the office today for a BPP and non-reactive NST, told to go home and pack her bags and come to MAU for extended monitoring with the understanding that she would likely not go home. Patient reports she was not told that she was going to be directly admitted to the hospital. MAU provider was called to bedside by RN who reports patient is feeling contractions and "breathing through them."  O BP (!) 166/91 (BP Location: Right Arm)   Pulse 70   Temp 98.8 F (37.1 C) (Oral)   Resp 20   Ht 5' 5.5" (1.664 m)   Wt (!) 141.3 kg   LMP 05/09/2019   SpO2 98%   BMI 51.03 kg/m    Patient Vitals for the past 24 hrs:  BP Temp Temp src Pulse Resp SpO2 Height Weight  01/20/20 2134 (!) 166/91 -- -- 70 -- -- -- --  01/20/20 2125 -- -- -- -- -- 98 % -- --  01/20/20 2120 -- -- -- -- -- 99 % -- --  01/20/20 2117 (!) 163/90 98.8 F (37.1 C) Oral 71 -- 99 % -- --  01/20/20 2115 -- -- -- -- -- 99 % -- --  01/20/20 2114 (!) 155/89 -- -- 75 -- -- -- --  01/20/20 1945 (!) 146/85 98.5 F (36.9 C) Oral 76 20 98 % -- --  01/20/20 1944 -- -- -- -- -- -- 5' 5.5" (1.664 m) (!) 141.3 kg   Physical Exam  Constitutional: She is oriented to person, place, and time. She appears well-developed and well-nourished. No distress.  HENT:  Head: Normocephalic and atraumatic.  Respiratory: Effort normal.  GI: Soft.  Genitourinary:    Genitourinary Comments: CE: 2/long/posterior with bloody show   Neurological: She is alert and oriented to person, place, and time.  Skin: Skin is warm and dry. She is not diaphoretic.  Psychiatric: She has a normal mood and affect. Her behavior is normal. Judgment and thought content normal.    A Pregnant female [redacted]w[redacted]d Medical screening exam complete Dr. Juliene Pina was  called after 8 minutes of patient being on monitor as patient had 2 decelerations during that time, one deceleration into the 40s, which Dr. Juliene Pina was made aware of. Dr. Juliene Pina also notified of severe range BP on admission to MAU. Dr. Juliene Pina requests patient to be NPO and for MAU provider to enter orders for betamethasone to be given in MAU. RN notified. EFM: non-reactive with variables       -baseline: 150       -variability: moderate       -accels: absent       -decels: multiple early and variables       -TOCO: apprx q15min CE: 2/posterior/long with bloody show Per Dr. Juliene Pina, admit to [facility]  P Admit to [facility] NPO Betamethasone  Poonam Woehrle, Odie Sera, NP 01/20/2020 9:44 PM
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
## 2020-01-21 ENCOUNTER — Encounter (HOSPITAL_COMMUNITY): Payer: Self-pay | Admitting: Obstetrics & Gynecology

## 2020-01-21 DIAGNOSIS — O1414 Severe pre-eclampsia complicating childbirth: Secondary | ICD-10-CM | POA: Diagnosis present

## 2020-01-21 DIAGNOSIS — O99892 Other specified diseases and conditions complicating childbirth: Secondary | ICD-10-CM

## 2020-01-21 DIAGNOSIS — O1410 Severe pre-eclampsia, unspecified trimester: Secondary | ICD-10-CM | POA: Diagnosis present

## 2020-01-21 HISTORY — DX: Severe pre-eclampsia complicating childbirth: O14.14

## 2020-01-21 HISTORY — DX: Other specified diseases and conditions complicating childbirth: O99.892

## 2020-01-21 HISTORY — DX: Severe pre-eclampsia, unspecified trimester: O14.10

## 2020-01-21 LAB — CBC
HCT: 38.7 % (ref 36.0–46.0)
Hemoglobin: 12.6 g/dL (ref 12.0–15.0)
MCH: 29.4 pg (ref 26.0–34.0)
MCHC: 32.6 g/dL (ref 30.0–36.0)
MCV: 90.4 fL (ref 80.0–100.0)
Platelets: 234 10*3/uL (ref 150–400)
RBC: 4.28 MIL/uL (ref 3.87–5.11)
RDW: 13.9 % (ref 11.5–15.5)
WBC: 20 10*3/uL — ABNORMAL HIGH (ref 4.0–10.5)
nRBC: 0 % (ref 0.0–0.2)

## 2020-01-21 LAB — PROTEIN / CREATININE RATIO, URINE
Creatinine, Urine: 321.56 mg/dL
Protein Creatinine Ratio: 4.59 mg/mg{Cre} — ABNORMAL HIGH (ref 0.00–0.15)
Total Protein, Urine: 1475 mg/dL

## 2020-01-21 LAB — GROUP B STREP BY PCR: Group B strep by PCR: POSITIVE — AB

## 2020-01-21 MED ORDER — SIMETHICONE 80 MG PO CHEW
80.0000 mg | CHEWABLE_TABLET | ORAL | Status: DC
  Administered 2020-01-22 – 2020-01-25 (×3): 80 mg via ORAL
  Filled 2020-01-21 (×3): qty 1

## 2020-01-21 MED ORDER — ACETAMINOPHEN 10 MG/ML IV SOLN: 1000.0000 mg | Freq: Once | INTRAVENOUS | Status: DC | PRN

## 2020-01-21 MED ORDER — PRENATAL MULTIVITAMIN CH
1.0000 | ORAL_TABLET | Freq: Every day | ORAL | Status: DC
  Administered 2020-01-21 – 2020-01-25 (×5): 1 via ORAL
  Filled 2020-01-21 (×5): qty 1

## 2020-01-21 MED ORDER — DEXTROSE 5 % IV SOLN
INTRAVENOUS | Status: DC | PRN
  Administered 2020-01-20: 3 g via INTRAVENOUS

## 2020-01-21 MED ORDER — SENNOSIDES-DOCUSATE SODIUM 8.6-50 MG PO TABS
2.0000 | ORAL_TABLET | ORAL | Status: DC
  Administered 2020-01-22 – 2020-01-25 (×3): 2 via ORAL
  Filled 2020-01-21 (×3): qty 2

## 2020-01-21 MED ORDER — ACETAMINOPHEN 325 MG PO TABS
650.0000 mg | ORAL_TABLET | ORAL | Status: DC | PRN
  Administered 2020-01-24 (×2): 650 mg via ORAL
  Filled 2020-01-21 (×3): qty 2

## 2020-01-21 MED ORDER — IBUPROFEN 600 MG PO TABS: 600.0000 mg | ORAL_TABLET | Freq: Four times a day (QID) | ORAL | Status: DC | PRN

## 2020-01-21 MED ORDER — BUPIVACAINE IN DEXTROSE 0.75-8.25 % IT SOLN
INTRATHECAL | Status: DC | PRN
  Administered 2020-01-20: 1.6 mL via INTRATHECAL

## 2020-01-21 MED ORDER — SIMETHICONE 80 MG PO CHEW
80.0000 mg | CHEWABLE_TABLET | Freq: Three times a day (TID) | ORAL | Status: DC
  Administered 2020-01-21 – 2020-01-25 (×14): 80 mg via ORAL
  Filled 2020-01-21 (×14): qty 1

## 2020-01-21 MED ORDER — MEASLES, MUMPS & RUBELLA VAC IJ SOLR: 0.5000 mL | Freq: Once | INTRAMUSCULAR | Status: DC

## 2020-01-21 MED ORDER — NALBUPHINE HCL 10 MG/ML IJ SOLN: 5.0000 mg | INTRAMUSCULAR | Status: DC | PRN

## 2020-01-21 MED ORDER — OXYCODONE HCL 5 MG PO TABS
5.0000 mg | ORAL_TABLET | Freq: Four times a day (QID) | ORAL | Status: DC | PRN
  Administered 2020-01-22: 5 mg via ORAL
  Filled 2020-01-21: qty 1

## 2020-01-21 MED ORDER — KETOROLAC TROMETHAMINE 30 MG/ML IJ SOLN: 30.0000 mg | Freq: Four times a day (QID) | INTRAMUSCULAR | Status: DC | PRN

## 2020-01-21 MED ORDER — PROMETHAZINE HCL 25 MG/ML IJ SOLN
INTRAMUSCULAR | Status: AC
  Filled 2020-01-21: qty 1

## 2020-01-21 MED ORDER — TETANUS-DIPHTH-ACELL PERTUSSIS 5-2.5-18.5 LF-MCG/0.5 IM SUSP: 0.5000 mL | Freq: Once | INTRAMUSCULAR | Status: DC

## 2020-01-21 MED ORDER — DIPHENHYDRAMINE HCL 50 MG/ML IJ SOLN: 12.5000 mg | INTRAMUSCULAR | Status: DC | PRN

## 2020-01-21 MED ORDER — SCOPOLAMINE 1 MG/3DAYS TD PT72
MEDICATED_PATCH | TRANSDERMAL | Status: DC | PRN
  Administered 2020-01-21: 1 via TRANSDERMAL

## 2020-01-21 MED ORDER — WITCH HAZEL-GLYCERIN EX PADS
1.0000 "application " | MEDICATED_PAD | CUTANEOUS | Status: DC | PRN
  Administered 2020-01-22: 1 via TOPICAL

## 2020-01-21 MED ORDER — SIMETHICONE 80 MG PO CHEW: 80.0000 mg | CHEWABLE_TABLET | ORAL | Status: DC | PRN

## 2020-01-21 MED ORDER — NALBUPHINE HCL 10 MG/ML IJ SOLN: 5.0000 mg | Freq: Once | INTRAMUSCULAR | Status: DC | PRN

## 2020-01-21 MED ORDER — MORPHINE SULFATE (PF) 0.5 MG/ML IJ SOLN
INTRAMUSCULAR | Status: DC | PRN
  Administered 2020-01-20: .15 mg via INTRATHECAL

## 2020-01-21 MED ORDER — DEXAMETHASONE SODIUM PHOSPHATE 4 MG/ML IJ SOLN
INTRAMUSCULAR | Status: DC | PRN
  Administered 2020-01-20: 4 mg via INTRAVENOUS

## 2020-01-21 MED ORDER — SCOPOLAMINE 1 MG/3DAYS TD PT72
MEDICATED_PATCH | TRANSDERMAL | Status: AC
  Filled 2020-01-21: qty 1

## 2020-01-21 MED ORDER — NALOXONE HCL 4 MG/10ML IJ SOLN
1.0000 ug/kg/h | INTRAVENOUS | Status: DC | PRN
  Filled 2020-01-21: qty 5

## 2020-01-21 MED ORDER — ENOXAPARIN SODIUM 80 MG/0.8ML ~~LOC~~ SOLN
0.5000 mg/kg | SUBCUTANEOUS | Status: DC
  Administered 2020-01-21 – 2020-01-24 (×4): 70 mg via SUBCUTANEOUS
  Filled 2020-01-21 (×4): qty 0.8

## 2020-01-21 MED ORDER — DIPHENHYDRAMINE HCL 25 MG PO CAPS: 25.0000 mg | ORAL_CAPSULE | Freq: Four times a day (QID) | ORAL | Status: DC | PRN

## 2020-01-21 MED ORDER — ONDANSETRON HCL 4 MG/2ML IJ SOLN
4.0000 mg | Freq: Three times a day (TID) | INTRAMUSCULAR | Status: DC | PRN
  Administered 2020-01-21: 4 mg via INTRAVENOUS

## 2020-01-21 MED ORDER — PROMETHAZINE HCL 25 MG/ML IJ SOLN
6.2500 mg | Freq: Once | INTRAMUSCULAR | Status: AC | PRN
  Administered 2020-01-21: 6.25 mg via INTRAVENOUS

## 2020-01-21 MED ORDER — IBUPROFEN 600 MG PO TABS
600.0000 mg | ORAL_TABLET | Freq: Four times a day (QID) | ORAL | Status: DC
  Administered 2020-01-21 – 2020-01-25 (×15): 600 mg via ORAL
  Filled 2020-01-21 (×15): qty 1

## 2020-01-21 MED ORDER — FENTANYL CITRATE (PF) 100 MCG/2ML IJ SOLN
INTRAMUSCULAR | Status: DC | PRN
  Administered 2020-01-20: 15 ug via INTRATHECAL

## 2020-01-21 MED ORDER — MENTHOL 3 MG MT LOZG: 1.0000 | LOZENGE | OROMUCOSAL | Status: DC | PRN

## 2020-01-21 MED ORDER — SCOPOLAMINE 1 MG/3DAYS TD PT72: 1.0000 | MEDICATED_PATCH | Freq: Once | TRANSDERMAL | Status: DC

## 2020-01-21 MED ORDER — NALOXONE HCL 0.4 MG/ML IJ SOLN: 0.4000 mg | INTRAMUSCULAR | Status: DC | PRN

## 2020-01-21 MED ORDER — COCONUT OIL OIL: 1.0000 "application " | TOPICAL_OIL | Status: DC | PRN

## 2020-01-21 MED ORDER — ONDANSETRON HCL 4 MG/2ML IJ SOLN
INTRAMUSCULAR | Status: AC
  Filled 2020-01-21: qty 2

## 2020-01-21 MED ORDER — ACETAMINOPHEN 500 MG PO TABS
1000.0000 mg | ORAL_TABLET | Freq: Four times a day (QID) | ORAL | Status: AC
  Administered 2020-01-21 (×3): 1000 mg via ORAL
  Filled 2020-01-21 (×3): qty 2

## 2020-01-21 MED ORDER — FENTANYL CITRATE (PF) 100 MCG/2ML IJ SOLN: 25.0000 ug | INTRAMUSCULAR | Status: DC | PRN

## 2020-01-21 MED ORDER — LABETALOL HCL 200 MG PO TABS
200.0000 mg | ORAL_TABLET | Freq: Three times a day (TID) | ORAL | Status: DC
  Administered 2020-01-21 – 2020-01-25 (×13): 200 mg via ORAL
  Filled 2020-01-21 (×13): qty 1

## 2020-01-21 MED ORDER — FENTANYL CITRATE (PF) 100 MCG/2ML IJ SOLN
INTRAMUSCULAR | Status: DC | PRN
  Administered 2020-01-21: 85 ug via INTRAVENOUS

## 2020-01-21 MED ORDER — KETOROLAC TROMETHAMINE 30 MG/ML IJ SOLN: 30.0000 mg | Freq: Once | INTRAMUSCULAR | Status: DC | PRN

## 2020-01-21 MED ORDER — DIPHENHYDRAMINE HCL 25 MG PO CAPS: 25.0000 mg | ORAL_CAPSULE | ORAL | Status: DC | PRN

## 2020-01-21 MED ORDER — DIBUCAINE (PERIANAL) 1 % EX OINT
1.0000 "application " | TOPICAL_OINTMENT | CUTANEOUS | Status: DC | PRN
  Administered 2020-01-23: 1 via RECTAL
  Filled 2020-01-21: qty 28

## 2020-01-21 MED ORDER — SODIUM CHLORIDE 0.9% FLUSH: 3.0000 mL | INTRAVENOUS | Status: DC | PRN

## 2020-01-21 MED ORDER — OXYTOCIN 40 UNITS IN NORMAL SALINE INFUSION - SIMPLE MED
2.5000 [IU]/h | INTRAVENOUS | Status: AC
  Administered 2020-01-21: 2.5 [IU]/h via INTRAVENOUS

## 2020-01-21 NOTE — Lactation Note (Addendum)
This note was copied from a baby's chart. Lactation Consultation Note  Patient Name: [name] QDIYM'E Date: 01/21/2020   P1, Baby 12 hours old in NICU.  CGA 35 weeks. < 4 lbs.  Mother on MgSO4. Mother has been pumping and expressed approx 20 ml with last session. Reviewed hand expression with drops expressed.  Discussed pumping frequency. Mother does not have personal DEBP.  Mother will call her insurance today.  Provided information regarding gift shop rental also. Milk labels ordered and NICU book given.  Reviewed cleaning and milk storage. Observed pumping with 24 flanges which mother feels comfortable with. Mom made aware of O/P services, breastfeeding support groups, community resources, and our phone # for post-discharge questions.       Maternal Data    Feeding    LATCH Score                   Interventions    Lactation Tools Discussed/Used     Consult Status      Tammy Howard 01/21/2020, 12:55 PM

## 2020-01-21 NOTE — Progress Notes (Signed)
Orders received for IV Labetalol from Dr. Juliene Pina at 2140. RN evaluated for IV. Pt difficult stick. PEliseo Gum on unit and asked to assess for IV. IV attempt x2 in left hand and left arm with 20 gauge angiocath by Grady,RN @ 2150 and 2155.  Lab in to draw blood @2159 . IV started by cwhite,rn @2209  in right forearm with 20 gauge.

## 2020-01-21 NOTE — Transfer of Care (Signed)
Immediate Anesthesia Transfer of Care Note  Patient: [name]  Procedure(s) Performed: CESAREAN SECTION (N/A )  Patient Location: PACU  Anesthesia Type:Spinal  Level of Consciousness: awake, alert  and oriented  Airway & Oxygen Therapy: Patient Spontanous Breathing  Post-op Assessment: Report given to RN and Post -op Vital signs reviewed and stable  Post vital signs: Reviewed and stable  Last Vitals:  Vitals Value Taken Time  BP 147/79 01/21/20 0053  Temp 36.5 C 01/21/20 0053  Pulse 64 01/21/20 0055  Resp 18 01/21/20 0055  SpO2 98 % 01/21/20 0055  Vitals shown include unvalidated device data.  Last Pain:  Vitals:   01/20/20 2117  TempSrc: Oral         Complications: No apparent anesthesia complications

## 2020-01-21 NOTE — Progress Notes (Signed)
POSTOPERATIVE DAY # 1 S/P Primary LTCS for non-reassuring fetal heart rate remote from delivery, sever pre-eclampsia, baby boy in NICU  S:         Reports feeling better today after delivery; reports some anxiety about delivery yesterday  Denies HA, visual changes, RUQ/epigastric pain              Tolerating po intake / no nausea / no vomiting / minimal flatus / no BM  Denies dizziness, SOB, or CP             Bleeding is light             Pain controlled with Tylenol             Up ad lib / ambulatory with assistance/ foley cathter in place  Newborn stable on vent in NICU; mom is pumping colostrum   O:  VS: BP 134/73   Pulse 67   Temp 97.7 F (36.5 C) (Oral)   Resp 18   Ht 5' 5.5" (1.664 m)   Wt (!) 141.3 kg   LMP 05/09/2019   SpO2 96%   Breastfeeding Unknown   BMI 51.03 kg/m  Patient Vitals for the past 24 hrs:  BP Temp Temp src Pulse Resp SpO2 Height Weight  01/21/20 1224 119/65 98.8 F (37.1 C) Oral 74 -- 98 % -- --  01/21/20 1223 -- -- -- -- 18 -- -- --  01/21/20 0902 134/73 -- -- 67 17 -- -- --  01/21/20 0830 -- -- -- -- 18 -- -- --  01/21/20 0802 133/74 -- -- 64 -- -- -- --  01/21/20 0700 136/77 -- -- 61 -- 96 % -- --  01/21/20 0600 133/79 -- -- 71 18 98 % -- --  01/21/20 0500 137/80 -- -- 65 18 97 % -- --  01/21/20 0405 (!) 148/82 -- -- 64 18 96 % -- --  01/21/20 0305 (!) 145/69 -- -- 68 18 100 % -- --  01/21/20 0250 (!) 148/81 97.7 F (36.5 C) Oral 68 18 100 % -- --  01/21/20 0230 134/82 97.7 F (36.5 C) -- 76 (!) 23 100 % -- --  01/21/20 0215 (!) 143/85 -- -- 69 (!) 22 97 % -- --  01/21/20 0200 (!) 146/82 -- -- 77 (!) 27 99 % -- --  01/21/20 0145 (!) 159/95 97.7 F (36.5 C) -- 70 (!) 24 95 % -- --  01/21/20 0130 (!) 149/82 -- -- 64 (!) 22 96 % -- --  01/21/20 0115 (!) 152/80 97.7 F (36.5 C) -- 65 (!) 26 95 % -- --  01/21/20 0100 -- -- -- 65 (!) 26 93 % -- --  01/21/20 0053 (!) 147/79 97.7 F (36.5 C) -- -- -- 94 % -- --  01/20/20 2330 -- -- -- -- 18 -- --  --  01/20/20 2259 (!) 153/72 -- -- 77 -- -- -- --  01/20/20 2254 -- -- -- -- 17 99 % -- --  01/20/20 2244 -- -- -- -- 18 99 % -- --  01/20/20 2242 (!) 161/83 -- -- 75 -- -- -- --  01/20/20 2236 (!) 156/82 99 F (37.2 C) -- 70 -- -- -- --  01/20/20 2234 -- -- -- -- 18 -- -- --  01/20/20 2232 (!) 154/76 -- -- 68 -- -- -- --  01/20/20 2223 (!) 166/85 -- -- 67 20 -- -- --  01/20/20 2200 -- -- -- -- -- 99 % -- --  01/20/20 2155 -- -- -- -- -- 99 % -- --  01/20/20 2150 -- -- -- -- -- 99 % -- --  01/20/20 2147 (!) 161/86 -- -- 71 -- -- -- --  01/20/20 2145 -- -- -- -- -- 98 % -- --  01/20/20 2140 -- -- -- -- -- 99 % -- --  01/20/20 2135 -- -- -- -- -- 99 % -- --  01/20/20 2134 (!) 166/91 -- -- 70 -- -- -- --  01/20/20 2125 -- -- -- -- -- 98 % -- --  01/20/20 2120 -- -- -- -- -- 99 % -- --  01/20/20 2117 (!) 163/90 98.8 F (37.1 C) Oral 71 -- 99 % -- --  01/20/20 2115 -- -- -- -- -- 99 % -- --  01/20/20 2114 (!) 155/89 -- -- 75 -- -- -- --  01/20/20 1945 (!) 146/85 98.5 F (36.9 C) Oral 76 20 98 % -- --  01/20/20 1944 -- -- -- -- -- -- 5' 5.5" (1.664 m) (!) 141.3 kg   LABS:               Recent Labs    01/20/20 2159 01/21/20 0651  WBC 12.7* 20.0*  HGB 12.2 12.6  PLT 232 234               Bloodtype: --/--/A NEG (03/29 2159)  Rubella:    Non-immune                                           I&O: Intake/Output      03/29 0701 - 03/30 0700 03/30 0701 - 03/31 0700   P.O. 240    I.V. (mL/kg) 2814.7 (19.9) 337.6 (2.4)   Total Intake(mL/kg) 3054.7 (21.6) 337.6 (2.4)   Urine (mL/kg/hr) 575 500 (0.5)   Blood 364    Total Output 939 500   Net +2115.7 -162.4                     Physical Exam:             Alert and Oriented X3  Lungs: Clear and unlabored  Heart: regular rate and rhythm / no murmurs  Abdomen: soft, non-tender, mild gaseous distention, obese, active bowel sounds in all quadrants             Fundus: firm, non-tender, U-3             Dressing: Prevena wound dressing  c/d/i              Incision:  approximated with sutures / no erythema / no ecchymosis / no drainage  Perineum: intact  Lochia: small lochia on pad   Extremities: +1 LE edema, no calf pain or tenderness, +1 DTRs, no clonus bilaterally   A/P:     POD # 1 S/P Primary LTCS            Severe pre-eclampsia    - on Magnesium sulfate x 24 hrs   - no neural s/s   - Labs stable; add CMP for tomorrow AM   - BPs starting to normalize on Labetalol '200mg'$  TID  Obesity, BMI 51   - on Lovenox SQ for DVT prophylaxis   RH Negative, baby RH negative   - no Rhogam indicated  Rubella Non-immune   - offer MMR vaccine on day of d/c  Anxiety and depression    - on Zoloft   Routine postoperative care              Lactation support  Warm liquids and ambulation to promote bowel motility  Change Motrin '600mg'$  qhrs scheduled   Continue current care  Lars Pinks, MSN, CNM [facility]

## 2020-01-21 NOTE — Op Note (Addendum)
Cesarean Section Procedure Note   Tammy Howard  01/20/2020 - 01/21/2020  Indications: Fetal Distress and remote from delivery, severe preeclampsia . 34.6 weeks. Patient was sent from office for monitoring due to variable decels on NST and BPP 6/8 ie 6/10. In MAU intimal variable decels noted followed by mostly subtle decels with contractions. Severe range BPs noted, so IV labetalol given 20mg  x 2 doses and Magnesium started. She was being brought to L&D for induction but then repetitive severe variable decels noted down to 40s. Considering she was remote from delivery, emergency c-section was called.   Procedure: Emergency primary low transverse cesarean section   Pre-operative Diagnosis: Repetitive severe fetal decelerations, severe pre-eclampsia. 34.6 wks, remote from delivery  Post-operative Diagnosis: Same   Surgeon: , MD  Assistants: Shea Evans, CNM   Anesthesia: spinal   Procedure Details:  The patient was seen in the Holding Room. The risks, benefits, complications, treatment options, and expected outcomes were discussed with the patient. The patient concurred with the proposed plan, giving informed consent. identified as Tammy Howard and the procedure verified as C-Section Delivery. A Time Out was held and the above information confirmed. 3 gm Ancef given. Though it was emergency c-section, Spinal anesthesia was chosen due to poor airway but due to decels, I chose to proceed with foley followed by betadine splash, deferring vaginal prepping and also deferring typical Chloraprep for surgery. Drapes placed.  A pfannenstiel incision was made and carried down through the subcutaneous tissue to the fascia. Fascial incision was made and extended transversely. The fascia was separated from the underlying rectus tissue superiorly and inferiorly. The peritoneum was identified and entered. Peritoneal incision was extended longitudinally. Alexis-O retractor placed. Bladder flap was  not created. A low transverse uterine incision was made. Thick meconium stained amniotic fluid drained. Baby was engaged in pelvis. Delivered from cephalic presentation was a Female infant at 23.58 hours on 01/20/2020. Cord was wrapped two times around neck and also around body in criss-cross fashion under tension, I had to clamp and cut immediately and release baby from it and handed baby to NICU team in attendance. Apgar scores of 1 at one minute and 7 at five minutes. Delayed cord clamping was not possible. Cord ph was sent. Cord blood was obtained for evaluation. The placenta was removed Intact and appeared small . The uterine outline, tubes and ovaries appeared normal. The uterine incision was closed with running locked sutures of 01/22/2020. A second imbricating layer sutured.   Hemostasis was observed. Alexis retractor removed. Peritoneal closure done with 2-0 Vicryl.  Muscle bleeding assessed and cauterized. The fascia was then reapproximated with running sutures of 0Vicryl. The subcuticular closure was performed using 2-0plain gut. The skin was closed with 4-0Vicryl. Pravena dressing placed.   Instrument, sponge, and needle counts were correct prior the abdominal closure and were correct at the conclusion of the case.   Findings:Thick meconium amniotic fluid. Nuchal and body cord wrapped several times and thin cord. Female infant delivered at 23.58 hours on 01/20/20 Cephalic from [facility] hysterotomy, Apgars 1 and 7. Handed to NICU team immediately. Hysterotomy closed in 2 layers.    Estimated Blood Loss: 400 cc  Total IV Fluids: 2000 ml LR   Urine Output: 200CC OF clear urine  Specimens: Cord gas, cord blood, placenta   Complications: no complications  Disposition: PACU - hemodynamically stable.   Maternal Condition: stable, will transfer back to High Risk Ob unit for magnesium and close surveillance  Baby  condition / location:  NICU  Attending Attestation: I performed the procedure.    Signed: Surgeon(s): Azucena Fallen, MD

## 2020-01-21 NOTE — Anesthesia Postprocedure Evaluation (Signed)
Anesthesia Post Note  Patient: [name]  Procedure(s) Performed: CESAREAN SECTION (N/A )     Patient location during evaluation: PACU Anesthesia Type: Spinal Level of consciousness: oriented and awake and alert Pain management: pain level controlled Vital Signs Assessment: post-procedure vital signs reviewed and stable Respiratory status: spontaneous breathing, respiratory function stable and patient connected to nasal cannula oxygen Cardiovascular status: blood pressure returned to baseline and stable Postop Assessment: no headache, no backache and no apparent nausea or vomiting Anesthetic complications: no    Last Vitals:  Vitals:   01/21/20 0305 01/21/20 0405  BP: (!) 145/69 (!) 148/82  Pulse: 68 64  Resp: 18 18  Temp:    SpO2: 100% 96%    Last Pain:  Vitals:   01/21/20 0405  TempSrc:   PainSc: 0-No pain   Pain Goal: Patients Stated Pain Goal: 0 (01/20/20 2350)                 Shenell Rogalski L Jehiel Koepp

## 2020-01-21 NOTE — Anesthesia Procedure Notes (Signed)
Spinal  Patient location during procedure: OR Start time: 01/21/2020 11:41 PM End time: 01/21/2020 11:51 PM Staffing Performed: anesthesiologist  Anesthesiologist: Elmer Picker, MD Preanesthetic Checklist Completed: patient identified, IV checked, risks and benefits discussed, surgical consent, monitors and equipment checked, pre-op evaluation and timeout performed Spinal Block Patient position: sitting Prep: DuraPrep and site prepped and draped Patient monitoring: cardiac monitor, continuous pulse ox and blood pressure Approach: midline Location: L3-4 Injection technique: single-shot Needle Needle type: Pencan and Tuohy  Needle gauge: 24 G Needle length: 9 cm Assessment Sensory level: T6 Additional Notes Functioning IV was confirmed and monitors were applied. Sterile prep and drape, including hand hygiene and sterile gloves were used. The patient was positioned and the spine was prepped. The skin was anesthetized with lidocaine.  Free flow of clear CSF was obtained prior to injecting local anesthetic into the CSF.  The spinal needle aspirated freely following injection.  The needle was carefully withdrawn.  The patient tolerated the procedure well.

## 2020-01-21 NOTE — Anesthesia Preprocedure Evaluation (Signed)
Anesthesia Evaluation  Patient identified by MRN, date of birth, ID band Patient awake    Reviewed: Allergy & Precautions, NPO status , Patient's Chart, lab work & pertinent test results  Airway Mallampati: III  TM Distance: >3 FB Neck ROM: Full  Mouth opening: Limited Mouth Opening  Dental no notable dental hx. (+) Teeth Intact, Dental Advisory Given   Pulmonary neg pulmonary ROS,    Pulmonary exam normal breath sounds clear to auscultation       Cardiovascular hypertension (preE), Normal cardiovascular exam Rhythm:Regular Rate:Normal     Neuro/Psych  Headaches, negative psych ROS   GI/Hepatic negative GI ROS, Neg liver ROS,   Endo/Other  Morbid obesity (BMI 51)  Renal/GU negative Renal ROS  negative genitourinary   Musculoskeletal negative musculoskeletal ROS (+)   Abdominal   Peds  Hematology negative hematology ROS (+)   Anesthesia Other Findings Urgent primary C/S for preE and fetal intolerance to labor  Reproductive/Obstetrics (+) Pregnancy                             Anesthesia Physical Anesthesia Plan  ASA: III and emergent  Anesthesia Plan: Spinal   Post-op Pain Management:    Induction:   PONV Risk Score and Plan: 2 and Treatment may vary due to age or medical condition  Airway Management Planned: Natural Airway  Additional Equipment:   Intra-op Plan:   Post-operative Plan:   Informed Consent: I have reviewed the patients History and Physical, chart, labs and discussed the procedure including the risks, benefits and alternatives for the proposed anesthesia with the patient or authorized representative who has indicated his/her understanding and acceptance.     Dental advisory given  Plan Discussed with: CRNA  Anesthesia Plan Comments:         Anesthesia Quick Evaluation

## 2020-01-22 LAB — COMPREHENSIVE METABOLIC PANEL
ALT: 21 U/L (ref 0–44)
AST: 16 U/L (ref 15–41)
Albumin: 2 g/dL — ABNORMAL LOW (ref 3.5–5.0)
Alkaline Phosphatase: 95 U/L (ref 38–126)
Anion gap: 10 (ref 5–15)
BUN: 11 mg/dL (ref 6–20)
CO2: 25 mmol/L (ref 22–32)
Calcium: 7.7 mg/dL — ABNORMAL LOW (ref 8.9–10.3)
Chloride: 105 mmol/L (ref 98–111)
Creatinine, Ser: 0.6 mg/dL (ref 0.44–1.00)
GFR calc Af Amer: >60 mL/min (ref >60)
GFR calc non Af Amer: >60 mL/min (ref >60)
Glucose, Bld: 86 mg/dL (ref 70–99)
Potassium: 4.1 mmol/L (ref 3.5–5.1)
Sodium: 140 mmol/L (ref 135–145)
Total Bilirubin: 0.2 mg/dL — ABNORMAL LOW (ref 0.3–1.2)
Total Protein: 4.6 g/dL — ABNORMAL LOW (ref 6.5–8.1)

## 2020-01-22 LAB — CBC
HCT: 33.2 % — ABNORMAL LOW (ref 36.0–46.0)
Hemoglobin: 10.7 g/dL — ABNORMAL LOW (ref 12.0–15.0)
MCH: 29.6 pg (ref 26.0–34.0)
MCHC: 32.2 g/dL (ref 30.0–36.0)
MCV: 92 fL (ref 80.0–100.0)
Platelets: 237 10*3/uL (ref 150–400)
RBC: 3.61 MIL/uL — ABNORMAL LOW (ref 3.87–5.11)
RDW: 14.3 % (ref 11.5–15.5)
WBC: 11.6 10*3/uL — ABNORMAL HIGH (ref 4.0–10.5)
nRBC: 0 % (ref 0.0–0.2)

## 2020-01-22 LAB — SURGICAL PATHOLOGY

## 2020-01-22 MED ORDER — CALCIUM CARBONATE ANTACID 500 MG PO CHEW
400.0000 mg | CHEWABLE_TABLET | ORAL | Status: DC | PRN
  Administered 2020-01-22: 400 mg via ORAL
  Filled 2020-01-22: qty 2

## 2020-01-22 NOTE — Lactation Note (Signed)
This note was copied from a baby's chart. Lactation Consultation Note  Patient Name: Boy Marylu Dudenhoeffer DGUYQ'I Date: 01/22/2020 Reason for consult: Follow-up assessment;1st time breastfeeding;Primapara;Late-preterm 34-36.6wks;Infant < 6lbs;NICU baby  Visited with mom of a 23 hours old LPI NICU female < 4 lbs, mom told LC that she'll borrow a Medela DEBP from a friend upon her discharge. She also has private insurance and she'll be calling them for a pump. LC recommended a couple of good brands to get.  Mom is pumping every 3 hours and already getting some colostrum about 30 ml of Colostrum per pumping session, praised her for her efforts. Mom and dad were heading to the NICU to see baby, mom asked dad to bring DEBP kit so she can pump in the NICU, she was done for another pumping.  Stressed the importance that pumping early on is mainly for breast stimulation and not to get volume and asked mom not to get discouraged if she doesn't fill the "bullets" every time she pumps, she voiced understanding.  Feeding plan:  1. Encouraged mom to keep pumping every 3 hours, at least 8 pumping sessions/24 hours without going more than 6 hours without pumping 2. Hand expression and breast massage were also encouraged prior pumping  Dad present and supportive. Parents reported all questions and concerns were answered, they're both aware of [place] OP services and will call PRN.   Maternal Data    Feeding Feeding Type: Donor Breast Milk  LATCH Score                   Interventions Interventions: Breast feeding basics reviewed;DEBP  Lactation Tools Discussed/Used Tools: Pump Breast pump type: Double-Electric Breast Pump   Consult Status Consult Status: Follow-up Date: 01/23/20 Follow-up type: In-patient    Donnie Gedeon Francene Boyers 01/22/2020, 3:59 PM

## 2020-01-22 NOTE — Progress Notes (Signed)
Patient ID: Tammy Howard, female   DOB: 10/19/1991, 29 y.o.   MRN: [id number]  S/P Primary LTCS for non-reassuring fetal heart rate remote from delivery, severe pre-eclampsia, baby boy in NICU  Subjective: POD# 2 Live born female  Birth Weight: 3 lb 13.4 oz (1740 g) APGAR: 1, 7  Newborn Delivery   Birth date/time: 01/20/2020 23:58:00 Delivery type: C-Section, Low Transverse Trial of labor: No C-section categorization: Primary     Baby name: Elias Delivering provider: MODY, VAISHALI   circumcision planned, will check insurance benefits Feeding: breast / pumping  Pain control at delivery: Spinal   Reports feeling well, some soreness at incision site w/ movement  Patient reports tolerating PO.   Breast symptoms: + colostrum Pain controlled with PO meds Denies HA/SOB/C/P/N/V/dizziness. Flatus present. She reports vaginal bleeding as normal, without clots.  She is ambulating, urinating without difficulty.     Objective:   VS:    Vitals:   01/22/20 0034 01/22/20 0202 01/22/20 0602 01/22/20 0844  BP: 138/72 127/71 (!) 146/78 (!) 151/80  Pulse: 70 70 74 63  Resp: 18 18 18 18  Temp: 97.9 F (36.6 C) 98.3 F (36.8 C) 97.8 F (36.6 C) 97.6 F (36.4 C)  TempSrc: Oral Oral Oral Oral  SpO2: 99% 99% 99% 100%  Weight:   (!) 140 kg   Height:          Intake/Output Summary (Last 24 hours) at 01/22/2020 0847 Last data filed at 01/22/2020 0841 Gross per 24 hour  Intake 4339.59 ml  Output 6400 ml  Net -2060.41 ml     Filed Weights   01/20/20 1944 01/22/20 0602  Weight: (!) 141.3 kg (!) 140 kg     Recent Labs    01/21/20 0651 01/22/20 0626  WBC 20.0* 11.6*  HGB 12.6 10.7*  HCT 38.7 33.2*  PLT 234 237   CMP Latest Ref Rng & Units 01/22/2020 01/20/2020 01/16/2020  Glucose 70 - 99 mg/dL 86 81 96  BUN 6 - 20 mg/dL 11 11 12  Creatinine 0.44 - 1.00 mg/dL 0.60 0.51 0.58  Sodium 135 - 145 mmol/L 140 138 138  Potassium 3.5 - 5.1 mmol/L 4.1 3.8 4.3  Chloride 98 - 111 mmol/L 105  109 107  CO2 22 - 32 mmol/L 25 21(L) 21(L)  Calcium 8.9 - 10.3 mg/dL 7.7(L) 8.5(L) 9.2  Total Protein 6.5 - 8.1 g/dL 4.6(L) 5.2(L) 5.7(L)  Total Bilirubin 0.3 - 1.2 mg/dL 0.2(L) 0.4 0.2(L)  Alkaline Phos 38 - 126 U/L 95 125 137(H)  AST 15 - 41 U/L 16 26 18  ALT 0 - 44 U/L 21 34 15     Blood type: --/--/A NEG (03/29 2159)  Rubella:   immune Vaccines: TDaP UTD         Flu    UTD   Physical Exam:  General: alert, cooperative and no distress Abdomen: soft, nontender, normal bowel sounds Incision: clean, dry, intact and Prevena patent Uterine Fundus: firm, below umbilicus, nontender Lochia: minimal Ext: edema +1,+2 pedal, L>R, no redness or tenderness   Assessment/Plan: 29 y.o.   POD# 2. G1P0101                  Principal Problem:   Postpartum care following cesarean delivery 3/29 Active Problems:   BMI 50.0-59.9, adult (HCC)   Fetal heart rate decelerations affecting management of mother   Delivery by emergency cesarean - fetal distress  Severe preeclampsia - improved  - s/p Mag sulfate x 24   hrs PP             - no neural s/s              - Labs stable              - BPs labile to mild range on Labetalol 200mg TID    - good diuresis, continue I&O and daily weights until DC home   Obesity, BMI 51               - on Lovenox SQ for DVT prophylaxis     RH Negative, baby RH negative                - no Rhogam indicated     Rubella Non-immune                 - offer MMR vaccine on day of d/c     Anxiety and depression                  - on Zoloft    Doing well, stable.  Newborn stable in NICU, off vent   Encourage to ambulate Routine post-op care  Daniela C Paul, CNM, MSN 01/22/2020, 8:47 AM   

## 2020-01-22 NOTE — Clinical Social Work Maternal (Signed)
CLINICAL SOCIAL WORK MATERNAL/CHILD NOTE  Patient Details  Name: Tammy Howard MRN: [id number] Date of Birth: 10/28/1990  Date:  04/21/2020  Clinical Social Worker Initiating Note:  Blaine Hamper Date/Time: Initiated:  01/22/20/1300     Child's Name:  Kathi Ludwig   Biological Parents:  Mother, Father   Need for Interpreter:  None   Reason for Referral:  Behavioral Health Concerns   Address:  [address] [place] [address]    Phone number:  [phone] (home)     Additional phone number: FOB's number is [phone]  Household Members/Support Persons (HM/SP):   Household Member/Support Person 1   HM/SP Name Relationship DOB or Age  HM/SP -1 Rosalyn Gess Teagarden FOB 08/24/1988  HM/SP -2        HM/SP -3        HM/SP -4        HM/SP -5        HM/SP -6        HM/SP -7        HM/SP -8          Natural Supports (not living in the home):  Parent, Extended Family, Immediate Family, Friends   Herbalist: None   Employment: Part-time   Type of Work: Conservation officer, nature at [employer]   Education:  Engineer, maintenance (IT)   Homebound arranged:    Surveyor, quantity Resources:  Media planner   Other Resources:  (Information was provided to [employer] to apply for [facility])   Cultural/Religious Considerations Which May Impact Care: Per [employer] face sheet, MOB is [employer].   Strengths:  Ability to meet basic needs , Home prepared for child , Understanding of illness(Peds list provided to parents.)   Psychotropic Medications:         Pediatrician:       Pediatrician List:   Radiographer, therapeutic    [place]    [place] [facility]      Pediatrician Fax Number:    Risk Factors/Current Problems:  Mental Health Concerns    Cognitive State:  Able to Concentrate , Alert , Goal Oriented , Linear Thinking    Mood/Affect:  Comfortable , Interested , Happy , Calm , Bright    CSW Assessment: CSW meet with MOB to  complete an assessment for mental health hx.  When CSW arrived, MOB standing appearing as if she was in pain and FOB was resting in the recliner.  CSW offered to return at a later time to complete clinical assessment and MOB declined. CSW explained CSW's role and MOB gave CSW permission to complete assessment while FOB was present.  the couple appeared supportive of one another and expressed excitement about being new parents. MOB was inviting and polite. CSW inquired about MOB's MH hx and MOB denied having an "Official dx."  However, she explained having signs and symptoms of depression for past 2 years.  Per MOB, MOB was prescribed but MOB refused to take medication. MOB acknowledged being depressed and sad prior to MOB's pregnancy confirmation and reported most of her symptoms subsided after pregnancy. CSW educated MOB about PPD. CSW informed MOB of possible supports and interventions to decrease PPD.  CSW also encouraged MOB to seek medical attention if needed for increased signs and symptoms of PPD.  CSW also offered MOB resources for outpatient behavioral health services and MOB was receptive. MOB agreed to reach out to provided resources if a need  arises. MOB did not present with any acute symptoms and denied SI and HI when assessed. CSW also provided MOB with a PMAD self assessment and advise to complete bi weekly; MOB agreed.   CSW reviewed safe sleep and SIDS. MOB and was knowledgeable and asked appropriate questions.   CSW reviewed safe sleep and SIDS. MOB was knowledgeable and asked appropriate questions.  MOB communicated that MOB has everything she needs for the baby and is prepared to meet her infant's needs.  MOB did not have any further questions, concerns, or needs at this time.  CSW thanked the couple for allowing CSW to meet with them.   CSW will continue to offer resources and supports to family while infant remains in NICU.    CSW Plan/Description:  Psychosocial Support and Ongoing  Assessment of Needs, Sudden Infant Death Syndrome (SIDS) Education, Perinatal Mood and Anxiety Disorder (PMADs) Education, Other Patient/Family Education, Other Information/Referral to Community Resources   Kdyn Vonbehren Boyd-Gilyard, MSW, LCSW Clinical Social Work [phone]   Rhone Ozaki D BOYD-GILYARD, LCSW 01/22/2020, 3:00 PM 

## 2020-01-23 MED ORDER — HYDRALAZINE HCL 50 MG PO TABS
50.0000 mg | ORAL_TABLET | Freq: Three times a day (TID) | ORAL | Status: DC
  Administered 2020-01-23 – 2020-01-24 (×3): 50 mg via ORAL
  Filled 2020-01-23 (×3): qty 1

## 2020-01-23 MED ORDER — HYDROCORTISONE (PERIANAL) 2.5 % EX CREA: TOPICAL_CREAM | Freq: Two times a day (BID) | CUTANEOUS | Status: DC | PRN

## 2020-01-23 MED ORDER — HYDROCORTISONE 1 % EX CREA
TOPICAL_CREAM | Freq: Two times a day (BID) | CUTANEOUS | Status: DC | PRN
  Filled 2020-01-23: qty 28

## 2020-01-23 NOTE — Progress Notes (Signed)
POSTOPERATIVE DAY # 3 S/P Primary LTCS for non-reassuring fetal heart rate remote from delivery, severe pre-eclampsia, baby boy in NICU "Dewain Penning"   S:         Reports feeling okay, very tired today and c/o incisional pain and painful hemorrhoid  Denies HA, visual changes, RUQ/epigastric pain              Tolerating po intake / no nausea / no vomiting / + flatus / + BM  Denies dizziness, SOB, or CP             Bleeding is light             Pain controlled withMotrin and Oxycodone IR             Up ad lib / ambulatory/ voiding QS  Newborn stable off vent in NICU, mom pumping colostrum, but feels it has decreased    O:  VS: BP (!) 150/82 (BP Location: Right Arm)   Pulse 73   Temp 98 F (36.7 C) (Oral)   Resp 20   Ht 5' 5.5" (1.664 m)   Wt (!) 140 kg   LMP 05/09/2019   SpO2 99%   Breastfeeding Unknown   BMI 50.60 kg/m  Patient Vitals for the past 24 hrs:  BP Temp Temp src Pulse Resp SpO2 Weight  01/23/20 0935 -- -- -- -- -- -- (!) 137 kg  01/23/20 0839 (!) 142/72 97.7 F (36.5 C) Oral 64 18 97 % --  01/23/20 0221 -- -- -- -- -- 99 % --  01/23/20 0220 (!) 151/83 -- -- 74 -- -- --  01/22/20 2210 (!) 150/82 98 F (36.7 C) Oral 73 20 99 % --  01/22/20 1721 136/60 97.6 F (36.4 C) Oral 89 18 99 % --  01/22/20 1215 125/62 97.7 F (36.5 C) Oral 77 -- 99 % --    LABS:               Recent Labs    01/21/20 0651 01/22/20 0626  WBC 20.0* 11.6*  HGB 12.6 10.7*  PLT 234 237               Bloodtype: --/--/A NEG (03/29 2159)  Rubella:                                               I&O: Intake/Output      03/31 0701 - 04/01 0700 04/01 0701 - 04/02 0700   P.O. 900    I.V. (mL/kg) 243.1 (1.7)    Total Intake(mL/kg) 1143.1 (8.2)    Urine (mL/kg/hr) 1500 (0.4)    Total Output 1500    Net -356.9                      Physical Exam:             Alert and Oriented X3             Lungs: Clear and unlabored             Heart: regular rate and rhythm / no murmurs              Abdomen: soft, non-tender, mild gaseous distention, obese, active bowel sounds in all quadrants             Fundus: firm, non-tender, U-3  Dressing: Prevena wound dressing c/d/i              Incision:  approximated with sutures / no erythema / no ecchymosis / no drainage             Perineum: intact             Lochia: small lochia on pad              Extremities: +1 pitting LE edema, no calf pain or tenderness, +1 DTRs, no clonus bilaterally   A/P:     POD # 3 S/P Primary LTCS            Severe pre-eclampsia                          - s/p Magnesium sulfate x 24 hrs                         - no neural s/s                         - Labile BPs on Labetalol '200mg'$  TID   - Add Hydralazine '50mg'$  PO q8hrs             Obesity, BMI 51                         - on Lovenox SQ for DVT prophylaxis              RH Negative, baby RH negative                         - no Rhogam indicated             Rubella Non-immune                         - offer MMR vaccine on day of d/c             Anxiety and depression                          - on Zoloft   Hemorrhoid   - Hydrocortisone cream BID PRN             Routine postoperative care              Lactation support             Warm liquids and ambulation to promote bowel motility             Continue current care  Anticipate d/c home tomorrow  POC in consult with Dr. Ericka Pontiff, MSN, CNM [facility]

## 2020-01-24 MED ORDER — FERROUS SULFATE 325 (65 FE) MG PO TABS
325.0000 mg | ORAL_TABLET | Freq: Two times a day (BID) | ORAL | Status: DC
  Administered 2020-01-24 – 2020-01-25 (×3): 325 mg via ORAL
  Filled 2020-01-24 (×3): qty 1

## 2020-01-24 MED ORDER — NIFEDIPINE ER OSMOTIC RELEASE 30 MG PO TB24
30.0000 mg | ORAL_TABLET | Freq: Every day | ORAL | Status: DC
  Administered 2020-01-24 – 2020-01-25 (×2): 30 mg via ORAL
  Filled 2020-01-24 (×3): qty 1

## 2020-01-24 NOTE — Lactation Note (Signed)
This note was copied from a baby's chart. Lactation Consultation Note  Patient Name: Tammy Howard [id number] Date: 01/24/2020 Reason for consult: Follow-up assessment;Late-preterm 34-36.6wks Baby is 41 hours old in the NICU.  Baby is receiving gavage feedings.  Mom is pumping every 3 hours and obtaining 20 mls from each breast.  Mom would like first breastfeeding assist.  Baby is awake and sucking on pacifier.  Gavage feeding is in progress.  Discussed with mom feeding expectations for 35.3 weeks.  Baby placed in football hold.  Mom has short shafted nipples.  Hand expression yielded large drops of milk.  Baby fussy and unable to grasp breast tissue.  20 mm nipple shield reviewed and applied.  Baby gave weak sucks off and on but became very sleepy.  Mom shown how to do breast massage during feeding  Reassured mom and encouraged to call for assist prn.  Maternal Data    Feeding Feeding Type: Breast Fed  LATCH Score Latch: Repeated attempts needed to sustain latch, nipple held in mouth throughout feeding, stimulation needed to elicit sucking reflex.  Audible Swallowing: None  Type of Nipple: Everted at rest and after stimulation  Comfort (Breast/Nipple): Soft / non-tender  Hold (Positioning): Assistance needed to correctly position infant at breast and maintain latch.  LATCH Score: 6  Interventions    Lactation Tools Discussed/Used Tools: Nipple Shields Nipple shield size: 20   Consult Status Consult Status: PRN Follow-up type: In-patient    Huston Foley 01/24/2020, 11:37 AM

## 2020-01-24 NOTE — Plan of Care (Signed)
  Problem: Education: Goal: Knowledge of condition will improve Outcome: Progressing   Problem: Education: Goal: Knowledge of disease or condition will improve Outcome: Progressing

## 2020-01-24 NOTE — Progress Notes (Signed)
POSTOPERATIVE DAY # 4 S/P Primary LTCS for non-reassuring fetal heart rate remote from delivery, severe pre-eclampsia, baby boy in NICU "Dewain Penning"   S:         Reports feeling okay, very tired today and c/o incisional pain and painful hemorrhoid  Denies HA, visual changes, RUQ/epigastric pain              Tolerating po intake / no nausea / no vomiting / + flatus / + BM  Denies dizziness, SOB, or CP             Bleeding is light             Pain controlled withMotrin and Oxycodone IR             Up ad lib / ambulatory/ voiding QS  Newborn stable off vent in NICU, mom pumping colostrum, but feels it has decreased    O:  VS: BP (!) 146/74 (BP Location: Right Arm)   Pulse 71   Temp 98.3 F (36.8 C) (Oral)   Resp 18   Ht 5' 5.5" (1.664 m)   Wt (!) 139.7 kg   LMP 05/09/2019   SpO2 100%   Breastfeeding Unknown   BMI 50.48 kg/m  Patient Vitals for the past 24 hrs:  BP Temp Temp src Pulse Resp SpO2 Weight  01/24/20 0802 (!) 146/74 98.3 F (36.8 C) Oral 71 18 100 % --  01/24/20 0531 139/77 98.2 F (36.8 C) Oral 75 18 97 % --  01/24/20 0500 -- -- -- -- -- -- (!) 139.7 kg  01/23/20 2320 140/75 98.2 F (36.8 C) Oral 83 18 99 % --  01/23/20 2121 (!) 147/74 97.7 F (36.5 C) -- 78 -- 99 % --  01/23/20 1624 (!) 147/66 98.7 F (37.1 C) Oral 85 18 99 % --  01/23/20 1208 (!) 147/80 98.2 F (36.8 C) Oral 88 18 99 % --    LABS:               Recent Labs    01/22/20 0626  WBC 11.6*  HGB 10.7*  PLT 237               Bloodtype: --/--/A NEG (03/29 2159)  Rubella:                                               I&O: Intake/Output      04/01 0701 - 04/02 0700 04/02 0701 - 04/03 0700   P.O. 1980    I.V. (mL/kg)     Total Intake(mL/kg) 1980 (14.2)    Urine (mL/kg/hr) 2300 (0.7) 1100 (1.6)   Total Output 2300 1100   Net -320 -1100                     Physical Exam:             Alert and Oriented X3             Lungs: Clear and unlabored             Heart: regular rate and rhythm / no  murmurs             Abdomen: soft, non-tender, mild gaseous distention, obese, active bowel sounds in all quadrants             Fundus: firm, non-tender,  U-3             Dressing: Prevena wound dressing c/d/i              Incision:  approximated with sutures / no erythema / no ecchymosis / no drainage             Perineum: intact             Lochia: small lochia on pad              Extremities: +1 pitting LE edema, no calf pain or tenderness, +1 DTRs, no clonus bilaterally   A/P:     POD # 4 S/P Primary LTCS            Severe pre-eclampsia                          - s/p Magnesium sulfate x 24 hrs                         - no neural s/s                         - Labile BPs on Labetalol '200mg'$  TID   -Switched from Hydralazine to Procardia '30mg'$  XL today ----> Dr Murrell Redden coming on call at 1pm, can assess BP and plan D/c this evening or tomorrow.              Obesity, BMI 51                         - on Lovenox SQ for DVT prophylaxis              RH Negative, baby RH negative                         - no Rhogam indicated             Rubella Non-immune                         - offer MMR vaccine on day of d/c             Anxiety and depression                          - on Zoloft   Hemorrhoid   - Hydrocortisone cream BID PRN             Routine postoperative care               V.Lateasha Breuer MD

## 2020-01-24 NOTE — Plan of Care (Signed)
  Problem: Education: Goal: Knowledge of condition will improve Outcome: Progressing   Problem: Education: Goal: Knowledge of disease or condition will improve Outcome: Progressing   

## 2020-01-25 MED ORDER — FERROUS SULFATE 325 (65 FE) MG PO TABS: 325.0000 mg | ORAL_TABLET | Freq: Two times a day (BID) | ORAL | 3 refills | Status: AC

## 2020-01-25 MED ORDER — LABETALOL HCL 200 MG PO TABS: 200.0000 mg | ORAL_TABLET | Freq: Three times a day (TID) | ORAL | 1 refills | Status: AC

## 2020-01-25 MED ORDER — IBUPROFEN 600 MG PO TABS: 600.0000 mg | ORAL_TABLET | Freq: Four times a day (QID) | ORAL | 0 refills | Status: AC

## 2020-01-25 MED ORDER — OXYCODONE HCL 5 MG PO TABS: 5.0000 mg | ORAL_TABLET | Freq: Four times a day (QID) | ORAL | 0 refills | Status: DC | PRN

## 2020-01-25 MED ORDER — NIFEDIPINE ER 30 MG PO TB24: 30.0000 mg | ORAL_TABLET | Freq: Every day | ORAL | 1 refills | Status: DC

## 2020-01-25 NOTE — Progress Notes (Addendum)
Denies sob/cp, tol po, ambulating, voiding w/o difficulty, feels LE edema much improved and almost back to normal; +flatus and bm; normal lochia Baby advancing well with feeds, plans to try nursing baby today at 2p Notes anxiety/depression persist, has zoloft prescription already at home, just hasn't started, plans to begin now; no si/hi   Patient Vitals for the past 24 hrs:  BP Temp Temp src Pulse Resp SpO2 Weight  01/25/20 0939 122/68 -- -- 85 -- -- --  01/25/20 0531 138/69 97.7 F (36.5 C) Oral 79 18 99 % 135.7 kg  01/25/20 0009 140/75 98.1 F (36.7 C) Oral 94 18 97 % --  01/24/20 2215 129/65 98.3 F (36.8 C) Oral 83 18 97 % --  01/24/20 1515 133/75 97.9 F (36.6 C) Oral 82 18 100 % --  01/24/20 1300 -- -- -- -- -- 100 % --  01/24/20 1257 (!) 146/80 98 F (36.7 C) Oral 69 18 -- --    A&ox3 rrr ctab Abd: soft, nt, nd; dressing: intact; fundus firm and below umb; provena dressing LE: +1 LE, nt bilat  CBC Latest Ref Rng & Units 01/22/2020 01/21/2020 01/20/2020  WBC 4.0 - 10.5 K/uL 11.6(H) 20.0(H) 12.7(H)  Hemoglobin 12.0 - 15.0 g/dL 10.7(L) 12.6 12.2  Hematocrit 36.0 - 46.0 % 33.2(L) 38.7 37.2  Platelets 150 - 400 K/uL 237 234 232   CMP Latest Ref Rng & Units 01/22/2020 01/20/2020 01/16/2020  Glucose 70 - 99 mg/dL 86 81 96  BUN 6 - 20 mg/dL _0 Creatinine 0.44 - 1.00 mg/dL 0.60 0.51 0.58  Sodium 135 - 145 mmol/L 140 138 138  Potassium 3.5 - 5.1 mmol/L 4.1 3.8 4.3  Chloride 98 - 111 mmol/L 105 109 107  CO2 22 - 32 mmol/L 25 21(L) 21(L)  Calcium 8.9 - 10.3 mg/dL 7.7(L) 8.5(L) 9.2  Total Protein 6.5 - 8.1 g/dL 4.6(L) 5.2(L) 5.7(L)  Total Bilirubin 0.3 - 1.2 mg/dL 0.2(L) 0.4 0.2(L)  Alkaline Phos 38 - 126 U/L 95 125 137(H)  AST 15 - 41 U/L _1 ALT 0 - 44 U/L 21 34 15   A/P: pod 4 s/p 1ltcs (nrfht, severe pre-e) 1. Doing well, bp controlled, labetalol 239m tid, procardia 30xl; d/c home and f/u in 1 wk for bp check 2. Anxiety/depression - begin zoloft 3. Obesity,  lovenox for dvt proph, stop with d/c home 4.  Rh neg, baby rh neg 5. Rubella immune, mmr before d/c home

## 2020-01-25 NOTE — Discharge Summary (Signed)
Obstetric Discharge Summary Reason for Admission: induction of labor Prenatal Procedures: none Intrapartum Procedures: emergency 1ltcs d/t NRFHT Postpartum Procedures: magnesium sulfate Complications-Operative and Postpartum: none Hemoglobin  Date Value Ref Range Status  01/22/2020 10.7 (L) 12.0 - 15.0 g/dL Final   HCT  Date Value Ref Range Status  01/22/2020 33.2 (L) 36.0 - 46.0 % Final    Physical Exam:  General: alert, cooperative and no distress Lochia: appropriate Uterine Fundus: firm Incision: healing well DVT Evaluation: No evidence of DVT seen on physical exam.  Discharge Diagnoses: Term Pregnancy-delivered  Discharge Information: Date: 01/25/2020 Activity: pelvic rest and lifting restrictions Diet: routine Medications: Ibuprofen, Iron, Percocet and procardia, labetalol Condition: stable Instructions: refer to practice specific booklet Discharge to: home Follow-up Information    Vick Frees, MD. Call.   Specialty: Obstetrics and Gynecology Contact information: [address] [place] [address] [phone]          F/u in office in 1 wk for bp check and dressing removal Newborn Data: Live born female  Birth Weight: 3 lb 13.4 oz (1740 g) APGAR: 1, 7  Newborn Delivery   Birth date/time: 01/20/2020 23:58:00 Delivery type: C-Section, Low Transverse Trial of labor: No C-section categorization: Primary      Home with mother.  Vick Frees 01/25/2020, 1:31 PM

## 2020-01-30 ENCOUNTER — Ambulatory Visit: Payer: Self-pay

## 2020-01-30 NOTE — Lactation Note (Signed)
This note was copied from a baby's chart. Lactation Consultation Note  Patient Name: Boy Alechia Lezama [id number] Date: 01/30/2020 Reason for consult: Mother's request  1505 - 1535 - Ms. Date requested a lactation consult to assist with latching her son, Theone Murdoch. I assisted with placing him on the right breast in football hold. Prior to latching, I allowed him to suckle on a gloved finger. Rhythmic suckling sequences observed; I noted some breaks in suction (air around the finger).   We first attempted to latch to Ms. Kosanke' breast without a nipple shield. He opened and cued, but he did not grasp the breast. We placed a size 20 nipple shield on the breast, and he latched immediately with good suckling sequences and spontaneous swallows noted. He fed for about 10 minutes and released the breast; milk in shield noted. He then shortly thereafter began to cue again and went back onto the breast for an additional fifteen minutes. He finished and fell asleep and released the breast.  I praised Ms. Pernell Dupre and [employer] for a job well done. Ms. Angello was excited that I was able to see this.  We discussed her pumping regimen. She currently pumps about 6-7 times a day, and she ranges from 20 mls - 60 mls per breast with each pump. I encouraged her to to download a free pumping app and track her pumping sessions for 48 hours so we could better see when she pumps and how much she expresses in 24 hour period. We could then compare that to Eli's breast feedings sessions and determine if her milk production is adequate to support Eli's continued growth without needing further supplementation.  Ms. Headen has a personal Medela pump at home. It's used, but she thinks it's lightly used. She has BCBS, and I recommended that she call to see if her plan provides her with a new pump. Ms. Kuzel is also in the process of enrolling in [facility] in [place]. I offered to put in a referral.  At the end of the consult I listened to Ms. Cham'  concerns about the difficulties she had while pregnant with a stressful work environment. She became tearful, and I tried to provide reassurance and support. We agreed to follow up again the following Wednesday at 1500. I will put a note in the NICU appointment book in the lactation office.  Maternal Data Has patient been taught Hand Expression?: Yes Does the patient have breastfeeding experience prior to this delivery?: No  Feeding Feeding Type: Breast Fed  LATCH Score Latch: Grasps breast easily, tongue down, lips flanged, rhythmical sucking.  Audible Swallowing: Spontaneous and intermittent  Type of Nipple: Everted at rest and after stimulation  Comfort (Breast/Nipple): Soft / non-tender  Hold (Positioning): Assistance needed to correctly position infant at breast and maintain latch.  LATCH Score: 9  Interventions Interventions: Breast feeding basics reviewed;Assisted with latch;Hand express;Breast compression;Adjust position;Support pillows  Lactation Tools Discussed/Used Pump Review: Setup, frequency, and cleaning   Consult Status Consult Status: Follow-up Date: 01/31/20 Follow-up type: In-patient    Walker Shadow 01/30/2020, 5:10 PM

## 2020-02-03 ENCOUNTER — Inpatient Hospital Stay (HOSPITAL_COMMUNITY)
Admission: AD | Admit: 2020-02-03 | Discharge: 2020-02-03 | Disposition: A | Discharge status: Home / Self Care | Payer: BC Managed Care – PPO | Attending: Obstetrics & Gynecology | Admitting: Obstetrics & Gynecology

## 2020-02-03 ENCOUNTER — Other Ambulatory Visit: Payer: Self-pay

## 2020-02-03 ENCOUNTER — Encounter (HOSPITAL_COMMUNITY): Payer: Self-pay | Admitting: Obstetrics and Gynecology

## 2020-02-03 DIAGNOSIS — R55 Syncope and collapse: Secondary | ICD-10-CM | POA: Diagnosis present

## 2020-02-03 DIAGNOSIS — O99893 Other specified diseases and conditions complicating puerperium: Secondary | ICD-10-CM | POA: Insufficient documentation

## 2020-02-03 LAB — BASIC METABOLIC PANEL
Anion gap: 9 (ref 5–15)
BUN: 13 mg/dL (ref 6–20)
CO2: 26 mmol/L (ref 22–32)
Calcium: 8.9 mg/dL (ref 8.9–10.3)
Chloride: 102 mmol/L (ref 98–111)
Creatinine, Ser: 0.69 mg/dL (ref 0.44–1.00)
GFR calc Af Amer: >60 mL/min (ref >60)
GFR calc non Af Amer: >60 mL/min (ref >60)
Glucose, Bld: 100 mg/dL — ABNORMAL HIGH (ref 70–99)
Potassium: 4.6 mmol/L (ref 3.5–5.1)
Sodium: 137 mmol/L (ref 135–145)

## 2020-02-03 LAB — CBC
HCT: 41.5 % (ref 36.0–46.0)
Hemoglobin: 13 g/dL (ref 12.0–15.0)
MCH: 29.7 pg (ref 26.0–34.0)
MCHC: 31.3 g/dL (ref 30.0–36.0)
MCV: 94.7 fL (ref 80.0–100.0)
Platelets: 361 10*3/uL (ref 150–400)
RBC: 4.38 MIL/uL (ref 3.87–5.11)
RDW: 13.7 % (ref 11.5–15.5)
WBC: 7.8 10*3/uL (ref 4.0–10.5)
nRBC: 0 % (ref 0.0–0.2)

## 2020-02-03 LAB — URINALYSIS, ROUTINE W REFLEX MICROSCOPIC
Bilirubin Urine: NEGATIVE
Glucose, UA: NEGATIVE mg/dL
Ketones, ur: NEGATIVE mg/dL
Nitrite: NEGATIVE
Protein, ur: NEGATIVE mg/dL
Specific Gravity, Urine: 1.018 (ref 1.005–1.030)
pH: 5 (ref 5.0–8.0)

## 2020-02-03 NOTE — MAU Provider Note (Signed)
History     CSN: [id number]  Arrival date and time: 02/03/20 1102   First Provider Initiated Contact with Patient 02/03/20 1130     Chief Complaint  Patient presents with  . Near Syncope  . Headache   HPI  Tammy Howard is a 29 y.o. G1P0101 postpartum patient who presents to MAU for evaluation new onset fatigue, sense of feeling overheated and near-syncopal episode while staying with her baby in the NICU.  Patient is s/p primary cesarean on 01/21/2020. She is currently staying with her infant in full-time in the NICU.  Patient's pregnancy was complicated by Chronic Hypertension, which she manages with Labetalol. She was prescribed Labetalol 200 mg TID at hospital discharge and is compliant with this prescription. She denies history of dizziness, near syncope while on this dosage. She previously also took Procardia XL daily but this medication was discontinued last week due to recurrent headaches.  Patient states she is eating three meals each day but is having difficulty with hydration. She has eaten breakfast today but has not had anything to drink. She has already taken her morning medications including her first dose of Labetalol.  She denies abdominal pain, headache, visual disturbances, syncope, fever or recent illness.  Patient receives care with [facility] and has an appointment there Thursday 02/06/2020.  OB History    Gravida  1   Para  1   Term  0   Preterm  1   AB  0   Living  1     SAB  0   TAB  0   Ectopic  0   Multiple  0   Live Births  1           Past Medical History:  Diagnosis Date  . Hernia, epigastric   . Migraine with aura     Past Surgical History:  Procedure Laterality Date  . CESAREAN SECTION N/A 01/20/2020   Procedure: CESAREAN SECTION;  Surgeon: Shea Evans, MD;  Location: [facility];  Service: Obstetrics;  Laterality: N/A;  . EYE SURGERY      Family History  Problem Relation Age of Onset  . Chiari malformation Paternal  Great-grandmother   . Meniere's disease Mother   . COPD Father     Social History   Tobacco Use  . Smoking status: Never Smoker  . Smokeless tobacco: Never Used  Substance Use Topics  . Alcohol use: Not Currently    Comment: occ  . Drug use: Not Currently    Types: Marijuana    Comment: last smoked 2020    Allergies:  Allergies  Allergen Reactions  . Bee Venom Anaphylaxis    Swelling     Medications Prior to Admission  Medication Sig Dispense Refill Last Dose  . ferrous sulfate 325 (65 FE) MG tablet Take 1 tablet (325 mg total) by mouth 2 (two) times daily with a meal. 60 tablet 3   . ibuprofen (ADVIL) 600 MG tablet Take 1 tablet (600 mg total) by mouth every 6 (six) hours. 30 tablet 0   . labetalol (NORMODYNE) 200 MG tablet Take 1 tablet (200 mg total) by mouth 3 (three) times daily. 90 tablet 1   . NIFEdipine (ADALAT CC) 30 MG 24 hr tablet Take 1 tablet (30 mg total) by mouth daily. 30 tablet 1   . oxyCODONE (OXY IR/ROXICODONE) 5 MG immediate release tablet Take 1 tablet (5 mg total) by mouth every 6 (six) hours as needed for moderate pain. 30 tablet  0   . Prenatal Vit-Fe Fumarate-FA (PRENATAL MULTIVITAMIN) TABS tablet Take 1 tablet by mouth daily at 12 noon.       Review of Systems  Constitutional: Positive for fatigue.  Respiratory: Negative for shortness of breath.   Gastrointestinal: Negative for abdominal pain.  Genitourinary: Negative for dysuria and flank pain.  Musculoskeletal: Negative for back pain.  Neurological: Positive for dizziness. Negative for syncope and weakness.  All other systems reviewed and are negative.  Physical Exam   Blood pressure 112/62, pulse 87, temperature 98.2 F (36.8 C), temperature source Oral, resp. rate 16, SpO2 99 %, currently breastfeeding.  Physical Exam  Nursing note and vitals reviewed. Constitutional: She is oriented to person, place, and time. She appears well-developed and well-nourished.  Non-toxic appearance. She  does not have a sickly appearance. She does not appear ill. She appears distressed.  Cardiovascular: Normal rate and normal heart sounds.  Respiratory: Effort normal and breath sounds normal.  GI: Soft. She exhibits no distension and no mass. There is no abdominal tenderness. There is no rebound and no guarding.  Neurological: She is alert and oriented to person, place, and time.  Skin: Skin is warm and dry.  Cesarean incision well-approximated, no drainage, ecchymosis or streaking. Steri strips intact.  Psychiatric: She has a normal mood and affect. Her behavior is normal. Judgment and thought content normal.    MAU Course/MDM  Procedures  Patient Vitals for the past 24 hrs:  BP Temp Temp src Pulse Resp SpO2  02/03/20 1300 137/82 -- -- 74 -- 98 %  02/03/20 1246 124/69 -- -- 64 -- --  02/03/20 1231 117/62 -- -- 61 -- --  02/03/20 1230 118/64 -- -- 63 -- 98 %  02/03/20 1111 112/62 98.2 F (36.8 C) Oral 87 16 99 %  02/03/20 1109 -- -- -- -- -- 99 %   Results for orders placed or performed during the hospital encounter of 02/03/20 (from the past 24 hour(s))  Urinalysis, Routine w reflex microscopic     Status: Abnormal   Collection Time: 02/03/20 11:20 AM  Result Value Ref Range   Color, Urine YELLOW YELLOW   APPearance HAZY (A) CLEAR   Specific Gravity, Urine 1.018 1.005 - 1.030   pH 5.0 5.0 - 8.0   Glucose, UA NEGATIVE NEGATIVE mg/dL   Hgb urine dipstick SMALL (A) NEGATIVE   Bilirubin Urine NEGATIVE NEGATIVE   Ketones, ur NEGATIVE NEGATIVE mg/dL   Protein, ur NEGATIVE NEGATIVE mg/dL   Nitrite NEGATIVE NEGATIVE   Leukocytes,Ua TRACE (A) NEGATIVE   RBC / HPF 0-5 0 - 5 RBC/hpf   WBC, UA 6-10 0 - 5 WBC/hpf   Bacteria, UA RARE (A) NONE SEEN   Squamous Epithelial / LPF 0-5 0 - 5   Mucus PRESENT   CBC     Status: None   Collection Time: 02/03/20 12:09 PM  Result Value Ref Range   WBC 7.8 4.0 - 10.5 K/uL   RBC 4.38 3.87 - 5.11 MIL/uL   Hemoglobin 13.0 12.0 - 15.0 g/dL   HCT  41.5 36.0 - 46.0 %   MCV 94.7 80.0 - 100.0 fL   MCH 29.7 26.0 - 34.0 pg   MCHC 31.3 30.0 - 36.0 g/dL   RDW 13.7 11.5 - 15.5 %   Platelets 361 150 - 400 K/uL   nRBC 0.0 0.0 - 0.2 %  Basic metabolic panel     Status: Abnormal   Collection Time: 02/03/20 12:09 PM  Result Value Ref  Range   Sodium 137 135 - 145 mmol/L   Potassium 4.6 3.5 - 5.1 mmol/L   Chloride 102 98 - 111 mmol/L   CO2 26 22 - 32 mmol/L   Glucose, Bld 100 (H) 70 - 99 mg/dL   BUN 13 6 - 20 mg/dL   Creatinine, Ser 4.83 0.44 - 1.00 mg/dL   Calcium 8.9 8.9 - 50.7 mg/dL   GFR calc non Af Amer >60 >60 mL/min   GFR calc Af Amer >60 >60 mL/min   Anion gap 9 5 - 15   Assessment and Plan  --29 y.o. G1P0101 S/p primary cesarean 01/21/2020 --No concerning findings on physical exam or labs collected in MAU --Encouraged snacks in addition to three meals per day --Patient goal one pitcher of water per hour while in NICU --Discharge home in stable condition  F/U: --Keep appointment for Thursday 02/06/2020 with [facility]  Calvert Cantor, CNM 02/03/2020, 3:20 PM

## 2020-02-03 NOTE — Discharge Instructions (Signed)

## 2020-02-03 NOTE — MAU Note (Addendum)
Was upstairs in NICU with the baby and all of the sudden got flushed and felt a wave of heat, felt like she was going to pass out. Is on BP medicine, had pre-E.  (walked back from lobby, gait steady)

## 2020-02-04 ENCOUNTER — Ambulatory Visit: Payer: Self-pay

## 2020-02-04 NOTE — Lactation Note (Signed)
This note was copied from a baby's chart. Lactation Consultation Note  Patient Name: Tammy Howard [id number] Date: 02/04/2020    Baby 51 weeks old AGA [redacted]w[redacted]d  LC in to visit with Mom on day of baby's discharge from NICU.  Mom having a difficult time with anxiety and depression.  CSW came in to talk with Mom.  Mom went home and will return this evening for discharge.  Baby has been latching and feeding well, latch scores of 10 per NICU RN scoring.  Baby continues getting supplement of EBM by bottle along with occasional breastfeeding.    Mom desires OP lactation follow-up.  Message sent to clinic for an appointment next week.  Bea Laura RN will relay message to Mom that Clinic will call to set up appointment.    Judee Clara 02/04/2020, 2:00 PM

## 2020-02-18 ENCOUNTER — Ambulatory Visit: Payer: Self-pay

## 2020-02-18 NOTE — Lactation Note (Signed)
This note was copied from a baby's chart. 02/18/2020  Name: Tammy Howard MRN: [id number] Date of Birth: 01/20/2019 Gestational Age: Gestational Age: [redacted]w[redacted]d Birth Weight: 61.4 oz Weight today:  Weight: 5 lb 13 oz (2637 g)  General Information: Mother's reason for visit: Latch evaluation and weight check Consult: Initial Lactation consultant: Edd Arbour, MSN, CNM, IBCLC) Breastfeeding experience: First time mother Maternal medical conditions: Pregnancy induced hypertension Maternal medications: Pre-natal vitamin  Breastfeeding History: Frequency of breast feeding: Occasional Duration of feeding: >40min  Supplementation: Supplement method: bottle   Formula volume: 1-2oz Formula frequency: 8-12x/day Total formula volume per day: 8-24oz/day Breast milk volume: 1oz Breast milk frequency: 6x/day Total breast milk volume per day: </=6oz/day Pump type: Medela pump in style(and Symphony) Pump frequency: 6x/day if possible Pump volume: About 1oz  Infant Output Assessment: Voids per 24 hours: 10+   Stools per 24 hours: 6+ Stool color: Yellow  Breast Assessment: Breast: Soft, Compressible Nipple: Erect Pain level: 0   Feeding Assessment: Infant oral assessment: WNL   Positioning: Football Latch: 0 - Too sleepy or reluctant, no latch achieved, no sucking elicited. Audible swallowing: 0 - None Type of nipple: 2 - Everted at rest and after stimulation Comfort: 2 - Soft/non-tender Hold: 1 - Assistance needed to correctly position infant at breast and maintain latch LATCH score: 5    Lactation Consultation Note Tammy Howard (mother) and Tammy Howard presented for his latch evaluation and weight check. Tammy Howard was asleep in his carseat, not showing any hunger cues.  Tammy Howard stated that although he fed well at the breast in the hospital, he has done so well since. She said he "fights" at the breast and rarely gets a good latch, when he does he only stays on for or less. She is pumping  6x/day "if possible" and gets an ounce or less.   Tammy Howard has gained 1lb 1.7oz in 14 days for an average of 1.3oz/day.  Attempted to latch Tammy Howard, but even after getting a diaper change and weight check, he showed no hunger cues. He would wake up, but then go back to sleep and refuse the breast (with and without the nipple shield) by pursing his lips and turning his head away. He'd taken a feeding at 12:45pm (attempts to latch at 2:15pm).   Tammy Howard desires to produce more milk, but does not feel strongly about Tammy Howard eating exclusively at the breast. Discussed the importance of frequent pumping with correct sized flanges (corrected hers to #24 and gave an extra set). She is drinking/eating common galactagogues, advised she must pump frequently as well.   Also reviewed common ET newborn behavior. Encouraged her to keep trying to get him to latch at the breast, and protect her supply.   She requested PRN follow up in 1-2wks when he is closer to his [redacted]wks gestational age.  Bernerd Limbo, MSN, CNM, IBCLC 02/18/2020, 3:10 PM

## 2020-03-09 ENCOUNTER — Ambulatory Visit (INDEPENDENT_AMBULATORY_CARE_PROVIDER_SITE_OTHER): Payer: BC Managed Care – PPO | Admitting: Family Medicine

## 2020-03-09 ENCOUNTER — Other Ambulatory Visit: Payer: Self-pay

## 2020-03-09 ENCOUNTER — Encounter: Payer: Self-pay | Admitting: Family Medicine

## 2020-03-09 VITALS — BP 130/68 | HR 102 | Temp 97.4°F | Resp 18 | Ht 65.5 in | Wt 303.0 lb

## 2020-03-09 DIAGNOSIS — Z0001 Encounter for general adult medical examination with abnormal findings: Secondary | ICD-10-CM

## 2020-03-09 DIAGNOSIS — M7661 Achilles tendinitis, right leg: Secondary | ICD-10-CM | POA: Diagnosis not present

## 2020-03-09 DIAGNOSIS — Z13220 Encounter for screening for lipoid disorders: Secondary | ICD-10-CM

## 2020-03-09 DIAGNOSIS — Z6841 Body Mass Index (BMI) 40.0 and over, adult: Secondary | ICD-10-CM

## 2020-03-09 DIAGNOSIS — Z8669 Personal history of other diseases of the nervous system and sense organs: Secondary | ICD-10-CM

## 2020-03-09 DIAGNOSIS — Z7689 Persons encountering health services in other specified circumstances: Secondary | ICD-10-CM

## 2020-03-09 MED ORDER — PANTOPRAZOLE SODIUM 40 MG PO TBEC: 40.0000 mg | DELAYED_RELEASE_TABLET | Freq: Every day | ORAL | 3 refills | Status: AC

## 2020-03-09 MED ORDER — DICLOFENAC SODIUM 1 % EX GEL: 2.0000 g | Freq: Four times a day (QID) | CUTANEOUS | 1 refills | Status: AC

## 2020-03-09 NOTE — Progress Notes (Signed)
Subjective:    Patient ID: Tammy Howard, female    DOB: 03-Nov-1990, 29 y.o.   MRN: [id number]  HPI Patient is a 29 year Howard Caucasian female here today to establish care.  She does complain of pain in her right distal Achilles tendon at its insertion on the posterior right calcaneus.  She is tender to palpation at this point.  She states that the pain has been there for more than a year.  It began while she was working.  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.  However even after stopping the job she has had constant pain in her posterior right heel.  She is tender to palpation there today.  It hurts to dorsiflex her ankle as it stretches the Achilles tendon.  It also hurts to plantarflex ankle as it tightens the Achilles tendon.  She has negative Thompson sign today.  There is no evidence of rupture.  There is no bruising or swelling.  However she is tender to palpation.  She recently had a C-section.  This is her first child.  If the baby boy.  She is breast-feeding.  Pregnancy was complicated by gestational hypertension.  I reviewed her most recent CBC and CMP that were normal in the hospital.  She has not had her cholesterol checked in quite some time.  She is due for Covid vaccination.  Although not listed in her records, she has had an HIV screen through pregnancy as well as a Pap smear earlier with the pregnancy.  Therefore these are up-to-date.  She also has a past medical history of migraines.  She gets 1-2 a month.  She previously had eye surgery due to diplopia. Past Medical History:  Diagnosis Date  . Hernia, epigastric   . Migraine with aura    Past Surgical History:  Procedure Laterality Date  . CESAREAN SECTION N/A 01/20/2020   Procedure: CESAREAN SECTION;  Surgeon: Azucena Fallen, MD;  Location: [facility];  Service: Obstetrics;  Laterality: N/A;  . EYE SURGERY     Allergies  Allergen Reactions  . Bee Venom Anaphylaxis     Swelling    Social History   Socioeconomic History  . Marital status: Married    Spouse name: Not on file  . Number of children: Not on file  . Years of education: Not on file  . Highest education level: Not on file  Occupational History  . Not on file  Tobacco Use  . Smoking status: Never Smoker  . Smokeless tobacco: Never Used  Substance and Sexual Activity  . Alcohol use: Not Currently    Comment: occ  . Drug use: Not Currently    Types: Marijuana    Comment: last smoked 2020  . Sexual activity: Yes    Birth control/protection: None  Other Topics Concern  . Not on file  Social History Narrative  . Not on file   Social Determinants of Health   Financial Resource Strain:   . Difficulty of Paying Living Expenses:   Food Insecurity:   . Worried About Charity fundraiser in the Last Year:   . Arboriculturist in the Last Year:   Transportation Needs:   . Film/video editor (Medical):   Marland Kitchen Lack of Transportation (Non-Medical):   Physical Activity:   . Days of Exercise per Week:   . Minutes of Exercise per Session:   Stress:   . Feeling of Stress :  Social Connections:   . Frequency of Communication with Friends and Family:   . Frequency of Social Gatherings with Friends and Family:   . Attends Religious Services:   . Active Member of Clubs or Organizations:   . Attends Banker Meetings:   Marland Kitchen Marital Status:   Intimate Partner Violence:   . Fear of Current or Ex-Partner:   . Emotionally Abused:   Marland Kitchen Physically Abused:   . Sexually Abused:    Family History  Problem Relation Age of Onset  . Chiari malformation Paternal Great-grandmother   . Meniere's disease Mother   . COPD Father       Review of Systems  All other systems reviewed and are negative.      Objective:   Physical Exam Vitals reviewed.  Constitutional:      General: She is not in acute distress.    Appearance: Normal appearance. She is obese. She is not ill-appearing,  toxic-appearing or diaphoretic.  HENT:     Head: Normocephalic and atraumatic.     Right Ear: Tympanic membrane, ear canal and external ear normal. There is no impacted cerumen.     Left Ear: Tympanic membrane, ear canal and external ear normal. There is no impacted cerumen.     Nose: Nose normal. No congestion or rhinorrhea.     Mouth/Throat:     Mouth: Mucous membranes are moist.     Pharynx: Oropharynx is clear. No oropharyngeal exudate or posterior oropharyngeal erythema.  Eyes:     General: No scleral icterus.       Right eye: No discharge.        Left eye: No discharge.     Extraocular Movements: Extraocular movements intact.     Conjunctiva/sclera: Conjunctivae normal.     Pupils: Pupils are equal, round, and reactive to light.  Neck:     Vascular: No carotid bruit.  Cardiovascular:     Rate and Rhythm: Normal rate and regular rhythm.     Heart sounds: Normal heart sounds. No murmur. No gallop.   Pulmonary:     Effort: Pulmonary effort is normal. No respiratory distress.     Breath sounds: Normal breath sounds. No stridor. No wheezing, rhonchi or rales.  Chest:     Chest wall: No tenderness.  Abdominal:     General: Abdomen is flat. Bowel sounds are normal. There is no distension.     Palpations: Abdomen is soft. There is no mass.     Tenderness: There is no abdominal tenderness. There is no right CVA tenderness, left CVA tenderness, guarding or rebound.     Hernia: No hernia is present.  Musculoskeletal:     Cervical back: Normal range of motion and neck supple. No rigidity or tenderness.  Lymphadenopathy:     Cervical: No cervical adenopathy.  Neurological:     Mental Status: She is alert.           Assessment & Plan:  Screening cholesterol level - Plan: Lipid panel  Encounter to establish care with new doctor  Tendonitis, Achilles, right  History of migraine  BMI 50.0-59.9, adult (HCC)  First I recommended the Covid vaccination.  I would like her to  return fasting at her earliest convenience to check her cholesterol.  I will treat the Achilles tendinitis by having her wear good supportive tennis shoes as right now she is mainly wearing crocs or going barefoot.  I recommended a tennis shoe with a good arch insole support.  Also  recommended using diclofenac gel 2 g 4 times daily applied to the affected area as an anti-inflammatory.  We discussed her migraines and at the present time they are 1-2 a month and relatively infrequent.  At the present time she does not require any preventative or abortive therapy as Tylenol was working.  We discussed her weight.  I recommended 30 minutes a day 5 days a week of aerobic exercise.  I also recommended weight watchers as a  dietary plan.

## 2020-08-31 ENCOUNTER — Other Ambulatory Visit: Payer: Self-pay

## 2020-08-31 ENCOUNTER — Ambulatory Visit (INDEPENDENT_AMBULATORY_CARE_PROVIDER_SITE_OTHER): Payer: Medicaid Other | Admitting: Family Medicine

## 2020-08-31 VITALS — BP 140/60 | HR 88 | Temp 98.2°F | Ht 65.0 in | Wt 330.0 lb

## 2020-08-31 DIAGNOSIS — Z23 Encounter for immunization: Secondary | ICD-10-CM | POA: Diagnosis not present

## 2020-08-31 DIAGNOSIS — Z30011 Encounter for initial prescription of contraceptive pills: Secondary | ICD-10-CM

## 2020-08-31 DIAGNOSIS — F411 Generalized anxiety disorder: Secondary | ICD-10-CM

## 2020-08-31 MED ORDER — NORETHINDRONE ACET-ETHINYL EST 1.5-30 MG-MCG PO TABS: 1.0000 | ORAL_TABLET | Freq: Every day | ORAL | 11 refills | Status: AC

## 2020-08-31 NOTE — Progress Notes (Signed)
Subjective:    Patient ID: Tammy Howard, female    DOB: 12-23-1990, 29 y.o.   MRN: [id number]  HPI  Patient lost all of her insurance.  Therefore her gynecologist with no longer refill her medications per her report.  Therefore she stopped all of her birth control, her Zoloft, etc.  She is no longer taking any medication.  She has been off the Zoloft now for 2 months and she feels like she is doing well.  She denies any depression.  She does report anxiety issues and she would like to see a therapist for this.  She believes that she would benefit from talk therapy rather than medication.  She believes a lot of the anxiety and panic attacks stem from her father dying and just the stress of losing her job.  She believes that a counselor could help her manage this better.  However she would like to resume birth control.  She recently just finished her period last week.  She denies any pelvic pain.  She denies any history of DVT or pulmonary emboli in her family Past Medical History:  Diagnosis Date  . Anxiety   . Gestational hypertension   . Hernia, epigastric   . Migraine with aura    Past Surgical History:  Procedure Laterality Date  . CESAREAN SECTION N/A 01/20/2020   Procedure: CESAREAN SECTION;  Surgeon: Shea Evans, MD;  Location: [facility];  Service: Obstetrics;  Laterality: N/A;  . EYE SURGERY     diplopia   Current Outpatient Medications on File Prior to Visit  Medication Sig Dispense Refill  . ibuprofen (ADVIL) 600 MG tablet Take 1 tablet (600 mg total) by mouth every 6 (six) hours. 30 tablet 0  . diclofenac Sodium (VOLTAREN) 1 % GEL Apply 2 g topically 4 (four) times daily. (Patient not taking: Reported on 08/31/2020) 100 g 1  . ferrous sulfate 325 (65 FE) MG tablet Take 1 tablet (325 mg total) by mouth 2 (two) times daily with a meal. (Patient not taking: Reported on 08/31/2020) 60 tablet 3  . labetalol (NORMODYNE) 200 MG tablet Take 1 tablet (200 mg total) by mouth 3 (three)  times daily. (Patient not taking: Reported on 08/31/2020) 90 tablet 1  . pantoprazole (PROTONIX) 40 MG tablet Take 1 tablet (40 mg total) by mouth daily. (Patient not taking: Reported on 08/31/2020) 30 tablet 3  . Prenatal Vit-Fe Fumarate-FA (PRENATAL MULTIVITAMIN) TABS tablet Take 1 tablet by mouth daily at 12 noon. (Patient not taking: Reported on 08/31/2020)    . sertraline (ZOLOFT) 50 MG tablet Take 50 mg by mouth daily. (Patient not taking: Reported on 08/31/2020)     No current facility-administered medications on file prior to visit.   Allergies  Allergen Reactions  . Bee Venom Anaphylaxis    Swelling    Social History   Socioeconomic History  . Marital status: Married    Spouse name: Not on file  . Number of children: Not on file  . Years of education: Not on file  . Highest education level: Not on file  Occupational History  . Not on file  Tobacco Use  . Smoking status: Never Smoker  . Smokeless tobacco: Never Used  Vaping Use  . Vaping Use: Never used  Substance and Sexual Activity  . Alcohol use: Not Currently    Comment: occ  . Drug use: Not Currently    Types: Marijuana    Comment: last smoked 2020  . Sexual activity: Yes  Birth control/protection: None  Other Topics Concern  . Not on file  Social History Narrative  . Not on file   Social Determinants of Health   Financial Resource Strain:   . Difficulty of Paying Living Expenses: Not on file  Food Insecurity:   . Worried About Programme researcher, broadcasting/film/video in the Last Year: Not on file  . Ran Out of Food in the Last Year: Not on file  Transportation Needs:   . Lack of Transportation (Medical): Not on file  . Lack of Transportation (Non-Medical): Not on file  Physical Activity:   . Days of Exercise per Week: Not on file  . Minutes of Exercise per Session: Not on file  Stress:   . Feeling of Stress : Not on file  Social Connections:   . Frequency of Communication with Friends and Family: Not on file  .  Frequency of Social Gatherings with Friends and Family: Not on file  . Attends Religious Services: Not on file  . Active Member of Clubs or Organizations: Not on file  . Attends Banker Meetings: Not on file  . Marital Status: Not on file  Intimate Partner Violence:   . Fear of Current or Ex-Partner: Not on file  . Emotionally Abused: Not on file  . Physically Abused: Not on file  . Sexually Abused: Not on file     Review of Systems  All other systems reviewed and are negative.      Objective:   Physical Exam Constitutional:      Appearance: She is obese.  Cardiovascular:     Rate and Rhythm: Normal rate and regular rhythm.     Heart sounds: Normal heart sounds.  Pulmonary:     Effort: Pulmonary effort is normal.     Breath sounds: Normal breath sounds.  Abdominal:     General: Abdomen is flat. Bowel sounds are normal. There is no distension.     Palpations: Abdomen is soft.     Tenderness: There is no abdominal tenderness. There is no guarding or rebound.  Neurological:     Mental Status: She is alert.    Patient does have some tenderness to palpation over the right lateral compartment of her knee.  She declines an x-ray for this.  She believes she bruised it when she fell on a curb a week or so ago.       Assessment & Plan:  Encounter for initial prescription of contraceptive pills  Begin Loestrin 1 pill a day.  She recently finished her period and therefore she can start it now.  I will consult behavioral health regarding establishing her with a psychologist

## 2020-10-01 ENCOUNTER — Ambulatory Visit (INDEPENDENT_AMBULATORY_CARE_PROVIDER_SITE_OTHER): Payer: Medicaid Other | Admitting: Family Medicine

## 2020-10-01 ENCOUNTER — Other Ambulatory Visit: Payer: Self-pay

## 2020-10-01 VITALS — BP 110/76 | HR 104 | Temp 98.3°F | Ht 65.0 in | Wt 324.0 lb

## 2020-10-01 DIAGNOSIS — R202 Paresthesia of skin: Secondary | ICD-10-CM | POA: Diagnosis not present

## 2020-10-01 DIAGNOSIS — M62830 Muscle spasm of back: Secondary | ICD-10-CM

## 2020-10-01 NOTE — Progress Notes (Signed)
Subjective:    Patient ID: Tammy Howard, female    DOB: [birth year], 29 y.o.   MRN: [id number]  HPI Patient presents today with numbness in her right hand.  The numbness and burning in her hand will radiate up her right arm to the posterior aspect of the right shoulder.  She also complains of mid back pain roughly at the level of T5 just midline medial to the scapula.  This hurts when she standing for long periods of time.  To me the pain in the middle of her back sounds like muscle spasms.  The numbness and tingling radiating down her right arm could be potentially due to either cervical radiculopathy or carpal tunnel. Past Medical History:  Diagnosis Date  . Anxiety   . Gestational hypertension   . Hernia, epigastric   . Migraine with aura    Past Surgical History:  Procedure Laterality Date  . CESAREAN SECTION N/A 01/20/2020   Procedure: CESAREAN SECTION;  Surgeon: Shea Evans, MD;  Location: [facility];  Service: Obstetrics;  Laterality: N/A;  . EYE SURGERY     diplopia   Allergies  Allergen Reactions  . Bee Venom Anaphylaxis    Swelling    Social History   Socioeconomic History  . Marital status: Married    Spouse name: Not on file  . Number of children: Not on file  . Years of education: Not on file  . Highest education level: Not on file  Occupational History  . Not on file  Tobacco Use  . Smoking status: Never Smoker  . Smokeless tobacco: Never Used  Vaping Use  . Vaping Use: Never used  Substance and Sexual Activity  . Alcohol use: Not Currently    Comment: occ  . Drug use: Not Currently    Types: Marijuana    Comment: last smoked 2020  . Sexual activity: Yes    Birth control/protection: None  Other Topics Concern  . Not on file  Social History Narrative  . Not on file   Social Determinants of Health   Financial Resource Strain: Not on file  Food Insecurity: Not on file  Transportation Needs: Not on file  Physical Activity: Not on file  Stress:  Not on file  Social Connections: Not on file  Intimate Partner Violence: Not on file   Family History  Problem Relation Age of Onset  . Chiari malformation Paternal Great-grandmother   . Meniere's disease Mother   . COPD Father   . Heart disease Father       Review of Systems  All other systems reviewed and are negative.      Objective:   Physical Exam Vitals reviewed.  Constitutional:      General: She is not in acute distress.    Appearance: Normal appearance. She is obese. She is not ill-appearing, toxic-appearing or diaphoretic.  HENT:     Head: Normocephalic and atraumatic.     Right Ear: Tympanic membrane, ear canal and external ear normal. There is no impacted cerumen.     Left Ear: Tympanic membrane, ear canal and external ear normal. There is no impacted cerumen.     Nose: Nose normal. No congestion or rhinorrhea.     Mouth/Throat:     Mouth: Mucous membranes are moist.     Pharynx: Oropharynx is clear. No oropharyngeal exudate or posterior oropharyngeal erythema.  Eyes:     General: No scleral icterus.       Right eye: No discharge.  Left eye: No discharge.     Extraocular Movements: Extraocular movements intact.     Conjunctiva/sclera: Conjunctivae normal.     Pupils: Pupils are equal, Howard, and reactive to light.  Neck:     Vascular: No carotid bruit.  Cardiovascular:     Rate and Rhythm: Normal rate and regular rhythm.     Heart sounds: Normal heart sounds. No murmur heard. No gallop.   Pulmonary:     Effort: Pulmonary effort is normal. No respiratory distress.     Breath sounds: Normal breath sounds. No stridor. No wheezing, rhonchi or rales.  Chest:     Chest wall: No tenderness.  Abdominal:     General: Abdomen is flat. Bowel sounds are normal. There is no distension.     Palpations: Abdomen is soft. There is no mass.     Tenderness: There is no abdominal tenderness. There is no right CVA tenderness, left CVA tenderness, guarding or  rebound.     Hernia: No hernia is present.  Musculoskeletal:     Cervical back: Normal range of motion and neck supple. No rigidity, spasms or tenderness.     Thoracic back: Spasms and tenderness present.       Back:  Lymphadenopathy:     Cervical: No cervical adenopathy.  Neurological:     Mental Status: She is alert.           Assessment & Plan:  Muscle spasm of back  Arm paresthesia, right  Exam today is unremarkable.  Recommended nerve conduction studies of the right upper extremity to determine if she has cervical radiculopathy versus carpal tunnel syndrome.  Recommended physical therapy for the spasms of muscles in her back.  Also recommended weight loss as I believe a lot of the muscle spasms are due to body habitus

## 2020-10-09 ENCOUNTER — Other Ambulatory Visit: Payer: Self-pay | Admitting: *Deleted

## 2020-10-09 DIAGNOSIS — R202 Paresthesia of skin: Secondary | ICD-10-CM

## 2020-10-20 ENCOUNTER — Telehealth: Payer: Self-pay | Admitting: Family Medicine

## 2020-10-20 NOTE — Telephone Encounter (Signed)
Referred patient to [facility]

## 2020-10-20 NOTE — Telephone Encounter (Signed)
Pt tested postive for covid today wasnt sure what should she be doing to feel better ??

## 2020-10-21 ENCOUNTER — Telehealth: Payer: Self-pay | Admitting: Nurse Practitioner

## 2020-10-21 NOTE — Telephone Encounter (Signed)
Chart Review based on referral to  [facility]  A review of the patients medical history, medications, vital signs, vaccinations, and weight was performed to evaluate for risk factors associated with severe illness and hospitalization for COVID-19.   Criteria for qualifications are based on recommendations from the NIH and NCDHHS during periods of low supply and increased demand for treatment.   The following RF identified: Obesity, unvaccinated  At this time, the patient is not found to have enough risk factors to meet criteria for infusion treatment and will not be contacted.   If supply increases in the near future and patient is within the window of eligibility for treatment, re-evaluation may take place.

## 2020-10-27 ENCOUNTER — Ambulatory Visit: Payer: Medicaid Other | Admitting: Physical Therapy

## 2020-11-11 ENCOUNTER — Telehealth (INDEPENDENT_AMBULATORY_CARE_PROVIDER_SITE_OTHER): Payer: Medicaid Other | Admitting: Nurse Practitioner

## 2020-11-11 ENCOUNTER — Telehealth: Payer: Self-pay

## 2020-11-11 ENCOUNTER — Other Ambulatory Visit: Payer: Self-pay

## 2020-11-11 DIAGNOSIS — J019 Acute sinusitis, unspecified: Secondary | ICD-10-CM | POA: Diagnosis not present

## 2020-11-11 DIAGNOSIS — B9689 Other specified bacterial agents as the cause of diseases classified elsewhere: Secondary | ICD-10-CM

## 2020-11-11 MED ORDER — SALINE SPRAY 0.65 % NA SOLN: 1.0000 | NASAL | 3 refills | Status: AC | PRN

## 2020-11-11 MED ORDER — AMOXICILLIN 400 MG/5ML PO SUSR: 875.0000 mg | Freq: Two times a day (BID) | ORAL | 0 refills | Status: AC

## 2020-11-11 MED ORDER — FLUTICASONE PROPIONATE 50 MCG/ACT NA SUSP: 2.0000 | Freq: Every day | NASAL | 6 refills | Status: AC

## 2020-11-11 MED ORDER — GUAIFENESIN ER 600 MG PO TB12: 600.0000 mg | ORAL_TABLET | Freq: Two times a day (BID) | ORAL | 0 refills | Status: AC | PRN

## 2020-11-11 NOTE — Assessment & Plan Note (Addendum)
Acute, ongoing x days.  Fully recovered from COVID-19 about 1 week ago and with worsening cough, nasal congestion, sinus pain in past couple of days.  Likely bacterial sinusitis.  Will treat with amoxicillin 875 mg bid x 7 days, ocean nasal spray, flonase, and mucinex to help loosen secretions.  If no better in about 1 week, return to clinic and consider steroid.

## 2020-11-11 NOTE — Telephone Encounter (Signed)
Woke up this morning with right ear pain, and stuffiness in this ear. Had covid back in December, feels like sinus drainage. Not vaccinated ! Ok for [employer] Visit ?

## 2020-11-11 NOTE — Telephone Encounter (Signed)
Yes please

## 2020-11-11 NOTE — Patient Instructions (Signed)

## 2020-11-11 NOTE — Progress Notes (Signed)
Subjective:    Patient ID: Tammy Howard, female    DOB: May 29, 1991, 30 y.o.   MRN: [id number]  HPI: Tammy Howard is a 30 y.o. female presenting virtually for ear pain.  Chief Complaint  Patient presents with  . Sinusitis   EAR PAIN Reports she started having COVID symptoms 10/18/2020 and fully recovered after a couple of weeks.  2-3 days ago, started coughing up thick green mucus and throat started hurting last night.  This morning, woke up with feeling like cotton is in ear. Duration: days Involved ear(s): right Severity: moderate Quality:  Dull, worse when she swallows or burps  Fever: no Otorrhea: no Upper respiratory infection symptoms: yes Pruritus: no Hearing loss: yes Water immersion no; feels like water is in ear Using Q-tips: yes Recurrent otitis media: no  Cough: yes; productive with green mucus Shortness of breath: no Wheezing: no Chest pain: no Chest tightness: no Chest congestion: no Nasal congestion: yes Runny nose: yes Post nasal drip: no Sneezing: no Sore throat: yes Swollen glands: no Sinus pressure: yes Headache: yes Face pain: no Toothache: no Ear pain: yes; right ear  Ear pressure: yes  Eyes red/itching:no Eye drainage/crusting: no  Nausea: yes  Vomiting: no Diarrhea: no  Change in appetite: no  Loss of taste/smell: no  Rash: no Fatigue: yes Sick contacts: no Strep contacts: no  Context: worse Recurrent sinusitis: no Status: just started today Treatments attempted: Wall-tussin,   Allergies  Allergen Reactions  . Bee Venom Anaphylaxis    Swelling     Outpatient Encounter Medications as of 11/11/2020  Medication Sig  . amoxicillin (AMOXIL) 400 MG/5ML suspension Take 10.9 mLs (875 mg total) by mouth 2 (two) times daily for 7 days.  . fluticasone (FLONASE) 50 MCG/ACT nasal spray Place 2 sprays into both nostrils daily.  Marland Kitchen guaiFENesin (MUCINEX) 600 MG 12 hr tablet Take 1 tablet (600 mg total) by mouth 2 (two) times daily as  needed for cough or to loosen phlegm.  . sodium chloride (OCEAN) 0.65 % SOLN nasal spray Place 1 spray into both nostrils as needed for congestion.  . diclofenac Sodium (VOLTAREN) 1 % GEL Apply 2 g topically 4 (four) times daily. (Patient not taking: No sig reported)  . ferrous sulfate 325 (65 FE) MG tablet Take 1 tablet (325 mg total) by mouth 2 (two) times daily with a meal. (Patient not taking: No sig reported)  . ibuprofen (ADVIL) 600 MG tablet Take 1 tablet (600 mg total) by mouth every 6 (six) hours.  Marland Kitchen labetalol (NORMODYNE) 200 MG tablet Take 1 tablet (200 mg total) by mouth 3 (three) times daily. (Patient not taking: No sig reported)  . Norethindrone Acetate-Ethinyl Estradiol (LOESTRIN 1.5/30, 21,) 1.5-30 MG-MCG tablet Take 1 tablet by mouth daily. (Patient not taking: Reported on 10/01/2020)  . pantoprazole (PROTONIX) 40 MG tablet Take 1 tablet (40 mg total) by mouth daily. (Patient not taking: No sig reported)  . Prenatal Vit-Fe Fumarate-FA (PRENATAL MULTIVITAMIN) TABS tablet Take 1 tablet by mouth daily at 12 noon. (Patient not taking: No sig reported)  . sertraline (ZOLOFT) 50 MG tablet Take 50 mg by mouth daily. (Patient not taking: No sig reported)   No facility-administered encounter medications on file as of 11/11/2020.    Patient Active Problem List   Diagnosis Date Noted  . Acute bacterial sinusitis 11/11/2020  . BMI 50.0-59.9, adult (HCC) 10/25/2017  . Amenorrhea 10/25/2017  . Hirsutism 10/25/2017  . Migraine with aura  Past Medical History:  Diagnosis Date  . Anxiety   . Delivery by emergency cesarean - fetal distress 01/21/2020  . Fetal heart rate decelerations affecting management of mother 01/20/2020  . Gestational hypertension   . Hernia, epigastric   . Hypertension    Phreesia 11/11/2020  . Hypertension in pregnancy, preeclampsia, severe, delivered 01/21/2020  . Migraine with aura   . Postpartum care following cesarean delivery 3/29 01/21/2020  . Severe  preeclampsia 01/21/2020    Relevant past medical, surgical, family and social history reviewed and updated as indicated. Interim medical history since our last visit reviewed.  Review of Systems  Constitutional: Positive for fatigue. Negative for activity change, appetite change and fever.  HENT: Positive for congestion, ear pain, rhinorrhea, sinus pressure and sore throat. Negative for postnasal drip, sinus pain, sneezing and trouble swallowing.   Eyes: Negative.  Negative for pain, discharge, redness and itching.  Respiratory: Negative.  Negative for cough, chest tightness, shortness of breath and wheezing.   Cardiovascular: Negative.  Negative for chest pain.  Gastrointestinal: Positive for nausea. Negative for constipation, diarrhea and vomiting.  Skin: Negative.  Negative for color change and rash.  Neurological: Positive for headaches.  Hematological: Negative.  Negative for adenopathy.  Psychiatric/Behavioral: Negative.     Per HPI unless specifically indicated above     Objective:    There were no vitals taken for this visit.  Wt Readings from Last 3 Encounters:  10/01/20 (!) 324 lb (147 kg)  08/31/20 (!) 330 lb (149.7 kg)  03/09/20 (!) 303 lb (137.4 kg)    Physical Exam Physical examination unable to be performed due to lack of equipment.  Patient talking in complete sentences during telemedicine visit.    Assessment & Plan:   Problem List Items Addressed This Visit      Respiratory   Acute bacterial sinusitis - Primary    Acute, ongoing x days.  Fully recovered from COVID-19 about 1 week ago and with worsening cough, nasal congestion, sinus pain in past couple of days.  Likely bacterial sinusitis.  Will treat with amoxicillin 875 mg bid x 7 days, ocean nasal spray, flonase, and mucinex to help loosen secretions.  If no better in about 1 week, return to clinic and consider steroid.      Relevant Medications   amoxicillin (AMOXIL) 400 MG/5ML suspension   sodium  chloride (OCEAN) 0.65 % SOLN nasal spray   guaiFENesin (MUCINEX) 600 MG 12 hr tablet   fluticasone (FLONASE) 50 MCG/ACT nasal spray       Follow up plan: Return if symptoms worsen or fail to improve.  This visit was completed via telephone due to the restrictions of the COVID-19 pandemic. All issues as above were discussed and addressed but no physical exam was performed. If it was felt that the patient should be evaluated in the office, they were directed there. The patient verbally consented to this visit. Patient was unable to complete an audio/visual visit due to Lack of equipment. . Location of the patient: home . Location of the provider: work . Those involved with this call:  . Provider: Mardene Celeste, DNP . CMA: n/a . Front Desk/Registration: Flavia Shipper  . Time spent on call: 15 minutes on the phone discussing health concerns. 30 minutes total spent in review of patient's record and preparation of their chart.  I verified patient identity using two factors (patient name and date of birth). Patient consents verbally to being seen via telemedicine visit today.

## 2020-11-11 NOTE — Telephone Encounter (Signed)
Patient is scheduled for 12:15 with virtual office visit

## 2020-11-16 NOTE — Telephone Encounter (Signed)
Pt called office stating that her right ear is still every full and it is painful. She was last seen here on 11/11/20 by NP and is still taking her antibiotics. I have scheduled a f/u office visit for recheck. Pt stated understanding and is scheduled for 11/18/20, which also gives the antibiotics time to work.

## 2020-11-17 ENCOUNTER — Ambulatory Visit (INDEPENDENT_AMBULATORY_CARE_PROVIDER_SITE_OTHER): Payer: Medicaid Other | Admitting: Neurology

## 2020-11-17 ENCOUNTER — Other Ambulatory Visit: Payer: Self-pay

## 2020-11-17 ENCOUNTER — Encounter: Payer: Medicaid Other | Admitting: Neurology

## 2020-11-17 DIAGNOSIS — M79601 Pain in right arm: Secondary | ICD-10-CM | POA: Diagnosis not present

## 2020-11-17 DIAGNOSIS — Z0289 Encounter for other administrative examinations: Secondary | ICD-10-CM

## 2020-11-17 NOTE — Progress Notes (Signed)
Full Name: Tammy Howard Gender: Female MRN #: [id number] Date of Birth: 01/26/1991    Visit Date: 11/17/2020 09:36 Age: 30 Years Examining Physician: Despina Arias, MD  Referring Physician: Lynnea Ferrier, MD  History: Ms. Tammy Howard is a 30 year old woman with fluctuating numbness and pain going down the right arm into the right hand.  She notes having been diagnosed with carpal tunnel syndrome during a pregnancy.  Symptoms improved after delivery.  On examination, strength was normal.  She reported mildly reduced sensation in the hyperthenar eminence compared to the thenar eminence.  Nerve conduction studies:  The right median and ulnar motor responses had normal distal latencies, amplitudes and conduction velocities.  The right median and ulnar sensory responses had normal peak latencies and amplitudes.  The ulnar or F-wave latency was normal.  Electromyography: Needle EMG of selected muscles of the right arm was performed.  There were a few polyphasic motor units in the extensor digitorum communis muscle and the flexor carpi ulnaris muscle though motor unit recruitment was normal.  Other muscles tested had normal motor unit morphology and recruitment.  There was no abnormal spontaneous activity.  Impression: This NCV/EMG study shows the following: 1.   No evidence of median or ulnar neuropathy. 2.   Although there were no definite radiculopathies noted, a few polyphasic motor units were noted in 2 of 3 C7 innervated muscles tested and a minimal chronic C7 radiculopathy cannot be ruled out.  Tammy Howard Foot, MD, PhD, FAAN Certified in Neurology, Clinical Neurophysiology, Sleep Medicine, Pain Medicine and Neuroimaging Director, [facility]  [facility] [address], [address] [place], [place] [address] [phone]    Verbal informed consent was obtained from the patient, patient was informed of potential risk of  procedure, including bruising, bleeding, hematoma formation, infection, muscle weakness, muscle pain, numbness, among others.        MNC    Nerve / Sites Muscle Latency Ref. Amplitude Ref. Rel Amp Segments Distance Velocity Ref. Area    ms ms mV mV %  cm m/s m/s mVms  R Median - APB     Wrist APB 3.5 ?4.4 9.6 ?4.0 100 Wrist - APB 7   42.4     Upper arm APB 7.6  8.6  90.1 Upper arm - Wrist 22 54 ?49 36.9  R Ulnar - ADM     Wrist ADM 2.3 ?3.3 10.4 ?6.0 100 Wrist - ADM 7   29.7     B.Elbow ADM 5.8  8.3  79.6 B.Elbow - Wrist 22 63 ?49 24.4     A.Elbow ADM 7.5  8.4  101 A.Elbow - B.Elbow 10 60 ?49 24.5         SNC    Nerve / Sites Rec. Site Peak Lat Ref.  Amp Ref. Segments Distance    ms ms V V  cm  R Median - Orthodromic (Dig II, Mid palm)     Dig II Wrist 3.2 ?3.4 33 ?10 Dig II - Wrist 13  R Ulnar - Orthodromic, (Dig V, Mid palm)     Dig V Wrist 2.5 ?3.1 18 ?5 Dig V - Wrist 61         F  Wave    Nerve F Lat Ref.   ms ms  R Ulnar - ADM 28.0 ?32.0       EMG Summary Table    Spontaneous MUAP Recruitment  Muscle IA Fib PSW Fasc  Other Amp Dur. Poly Pattern  R. Deltoid Normal None None None _______ Normal Normal Normal Normal  R. Triceps brachii Normal None None None _______ Normal Normal Normal Normal  R. Biceps brachii Normal None None None _______ Normal Normal Normal Normal  R. Extensor digitorum communis Normal None None None _______ Normal Increased 1+ Normal  R. Flexor carpi ulnaris Normal None None None _______ Normal Normal 1+ Normal  R. First dorsal interosseous Normal None None None _______ Normal Normal Normal Normal  R. Abductor pollicis brevis Normal None None None _______ Normal Normal Normal Normal

## 2020-11-19 ENCOUNTER — Other Ambulatory Visit: Payer: Self-pay

## 2020-11-19 ENCOUNTER — Ambulatory Visit (INDEPENDENT_AMBULATORY_CARE_PROVIDER_SITE_OTHER): Payer: Medicaid Other | Admitting: Family Medicine

## 2020-11-19 VITALS — BP 134/86 | HR 90 | Temp 97.5°F | Ht 65.0 in | Wt 317.0 lb

## 2020-11-19 DIAGNOSIS — H6981 Other specified disorders of Eustachian tube, right ear: Secondary | ICD-10-CM | POA: Diagnosis not present

## 2020-11-19 DIAGNOSIS — H6501 Acute serous otitis media, right ear: Secondary | ICD-10-CM

## 2020-11-19 MED ORDER — PREDNISONE 20 MG PO TABS: ORAL_TABLET | ORAL | 0 refills | Status: AC

## 2020-11-19 MED ORDER — LEVOCETIRIZINE DIHYDROCHLORIDE 5 MG PO TABS: 5.0000 mg | ORAL_TABLET | Freq: Every evening | ORAL | 0 refills | Status: DC

## 2020-11-19 NOTE — Progress Notes (Signed)
Subjective:    Patient ID: Tammy Howard, female    DOB: September 29, 1991, 29 y.o.   MRN: [id number]  HPI Patient recently had COVID.  She had a telephone visit with my partner last week due to the fact her right ear was stopped up.  She reports that she cannot hear out of her right ear.  She also has sinus congestion and drainage.  My partner started on amoxicillin as well as Flonase for possible sinus infection.  She states that the situation in her ear is no better.  On examination, the right tympanic membrane is clear however there are air bubbles trapped behind the tympanic membrane and it is bulging due to a fluid accumulation.  Therefore she certainly has serous otitis media I believe secondary to eustachian tube dysfunction.  She is taking Flonase and amoxicillin.  She denies any fevers or chills or vertigo or tinnitus although she does have head congestion and rhinorrhea Past Medical History:  Diagnosis Date  . Anxiety   . Delivery by emergency cesarean - fetal distress 01/21/2020  . Fetal heart rate decelerations affecting management of mother 01/20/2020  . Gestational hypertension   . Hernia, epigastric   . Hypertension    Phreesia 11/11/2020  . Hypertension in pregnancy, preeclampsia, severe, delivered 01/21/2020  . Migraine with aura   . Postpartum care following cesarean delivery 3/29 01/21/2020  . Severe preeclampsia 01/21/2020   Past Surgical History:  Procedure Laterality Date  . CESAREAN SECTION N/A 01/20/2020   Procedure: CESAREAN SECTION;  Surgeon: Shea Evans, MD;  Location: [facility];  Service: Obstetrics;  Laterality: N/A;  . CESAREAN SECTION N/A    Phreesia 11/11/2020  . EYE SURGERY     diplopia   Allergies  Allergen Reactions  . Bee Venom Anaphylaxis    Swelling    Social History   Socioeconomic History  . Marital status: Married    Spouse name: Not on file  . Number of children: Not on file  . Years of education: Not on file  . Highest education level: Not  on file  Occupational History  . Not on file  Tobacco Use  . Smoking status: Never Smoker  . Smokeless tobacco: Never Used  Vaping Use  . Vaping Use: Never used  Substance and Sexual Activity  . Alcohol use: Not Currently    Comment: occ  . Drug use: Not Currently    Types: Marijuana    Comment: last smoked 2020  . Sexual activity: Yes    Birth control/protection: None  Other Topics Concern  . Not on file  Social History Narrative  . Not on file   Social Determinants of Health   Financial Resource Strain: Not on file  Food Insecurity: Not on file  Transportation Needs: Not on file  Physical Activity: Not on file  Stress: Not on file  Social Connections: Not on file  Intimate Partner Violence: Not on file   Family History  Problem Relation Age of Onset  . Chiari malformation Paternal Great-grandmother   . Meniere's disease Mother   . COPD Father   . Heart disease Father       Review of Systems  All other systems reviewed and are negative.      Objective:   Physical Exam Vitals reviewed.  Constitutional:      General: She is not in acute distress.    Appearance: Normal appearance. She is obese. She is not ill-appearing, toxic-appearing or diaphoretic.  HENT:  Head: Normocephalic and atraumatic.     Right Ear: Ear canal and external ear normal. Decreased hearing noted. A middle ear effusion is present. There is no impacted cerumen. Tympanic membrane is bulging. Tympanic membrane is not injected, scarred or perforated.     Left Ear: Tympanic membrane, ear canal and external ear normal. There is no impacted cerumen. Tympanic membrane is not injected, scarred or perforated.     Nose: Nose normal. No congestion or rhinorrhea.     Mouth/Throat:     Mouth: Mucous membranes are moist.     Pharynx: Oropharynx is clear. No oropharyngeal exudate or posterior oropharyngeal erythema.  Eyes:     General: No scleral icterus.       Right eye: No discharge.        Left  eye: No discharge.     Extraocular Movements: Extraocular movements intact.     Conjunctiva/sclera: Conjunctivae normal.     Pupils: Pupils are equal, Howard, and reactive to light.  Neck:     Vascular: No carotid bruit.  Cardiovascular:     Rate and Rhythm: Normal rate and regular rhythm.     Heart sounds: Normal heart sounds. No murmur heard. No gallop.   Pulmonary:     Effort: Pulmonary effort is normal. No respiratory distress.     Breath sounds: Normal breath sounds. No stridor. No wheezing, rhonchi or rales.  Chest:     Chest wall: No tenderness.  Abdominal:     General: Abdomen is flat. Bowel sounds are normal. There is no distension.     Palpations: Abdomen is soft. There is no mass.     Tenderness: There is no abdominal tenderness. There is no right CVA tenderness, left CVA tenderness, guarding or rebound.     Hernia: No hernia is present.  Musculoskeletal:     Cervical back: Normal range of motion and neck supple. No rigidity or tenderness.  Lymphadenopathy:     Cervical: No cervical adenopathy.  Neurological:     Mental Status: She is alert.           Assessment & Plan:  Right acute serous otitis media, recurrence not specified  Dysfunction of right eustachian tube  I believe the patient's hearing loss is due to the middle ear effusion.  She also appears to have air in the middle ear space is well as there are visible air bubbles that do not appear to be bullae on the tympanic membrane but rather air trapped behind the tympanic membrane in addition to the effusion.  Therefore I am going to add a prednisone taper pack along with the amoxicillin and also add Xyzal 5 mg daily.  Reassess in 1 week and if no better consult ENT for possible tympanostomy tube placement

## 2020-12-30 ENCOUNTER — Other Ambulatory Visit: Payer: Self-pay | Admitting: Family Medicine

## 2021-07-09 IMAGING — US US MFM FETAL BPP W/O NON-STRESS
1 series · 12 of 23 positions shown · non-contrast
Comparison: none

[Series 1: us mfm fetal bpp w/o non-stress · 23 acquisitions, 12 frames shown]
[im 1/23]
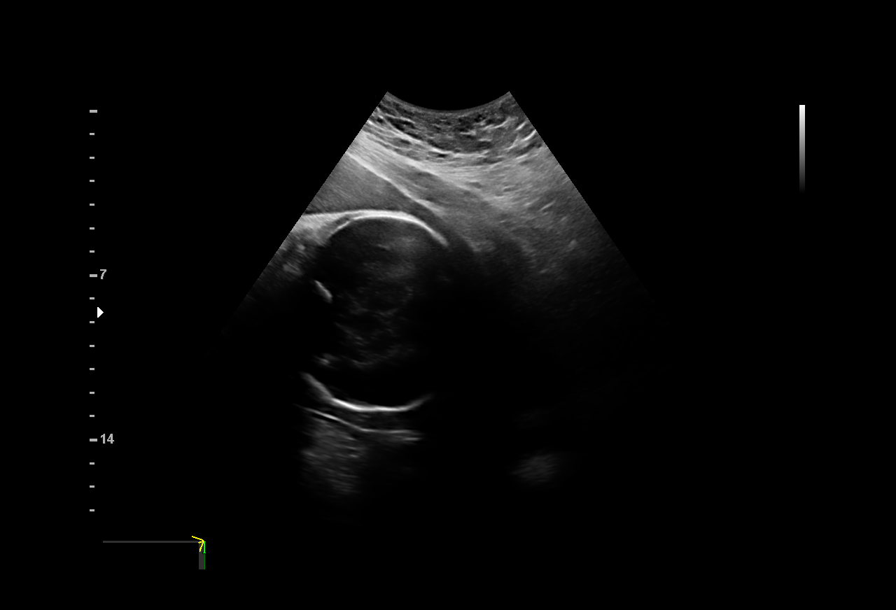
[im 3/23]
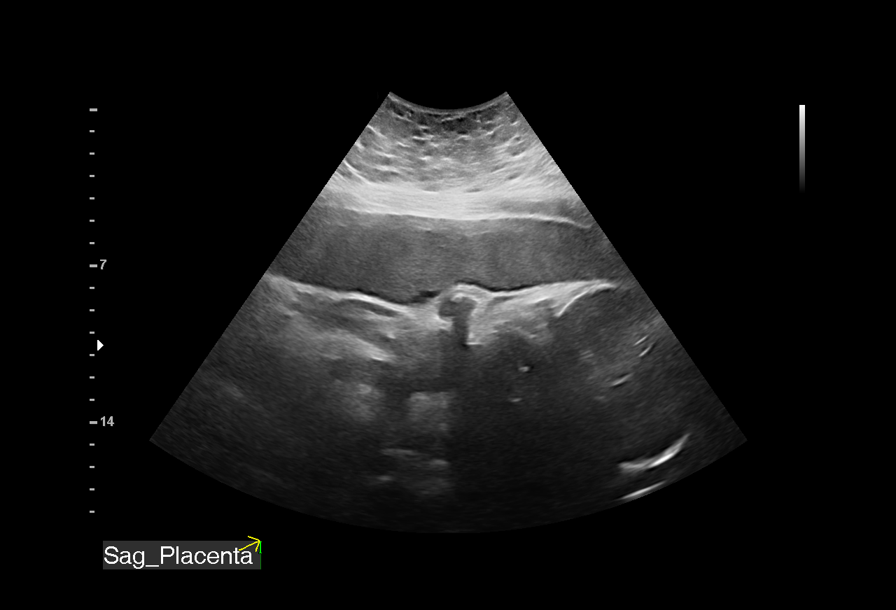
[im 5/23]
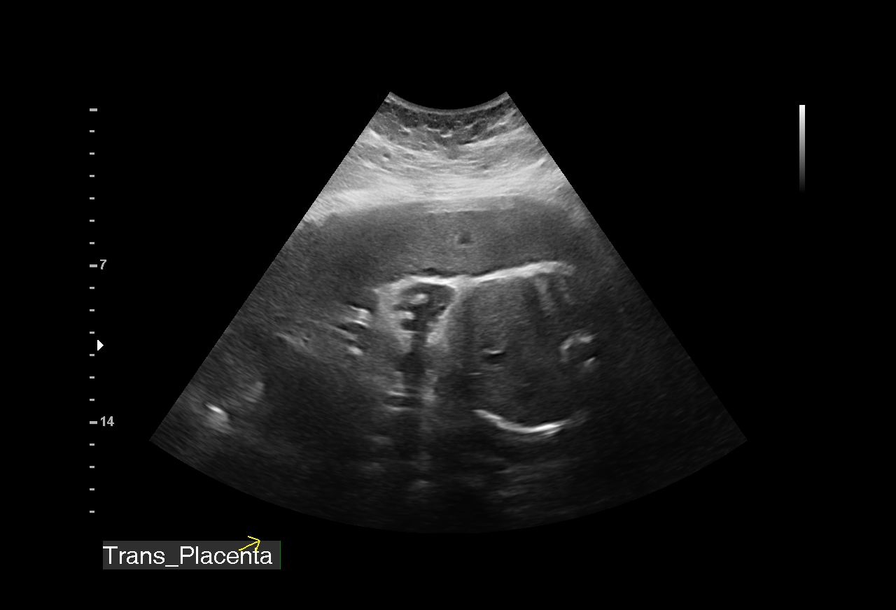
[im 7/23]
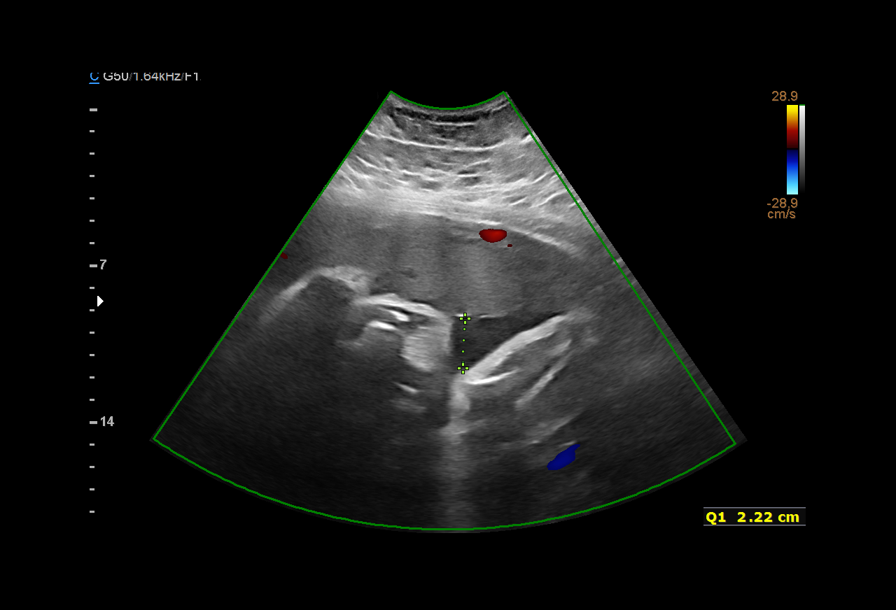
[im 9/23]
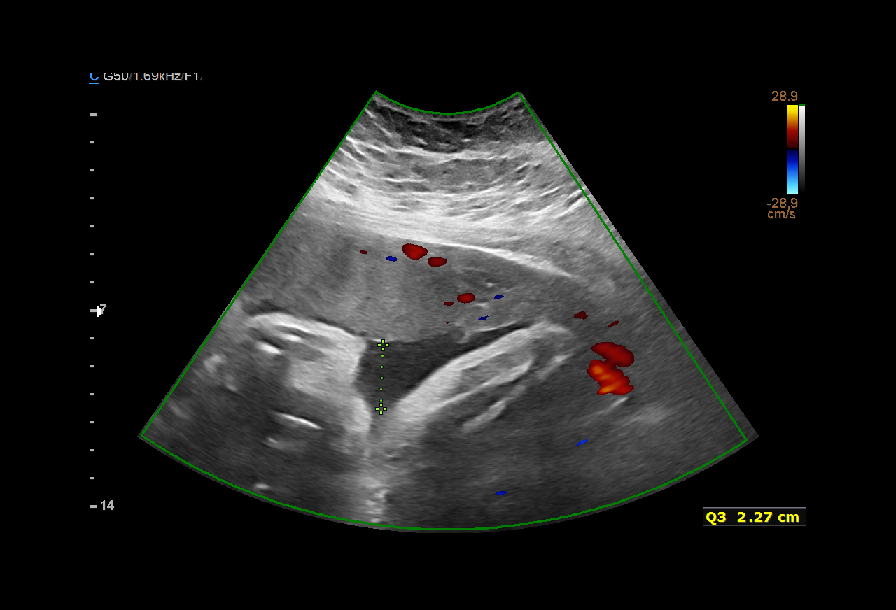
[im 11/23]
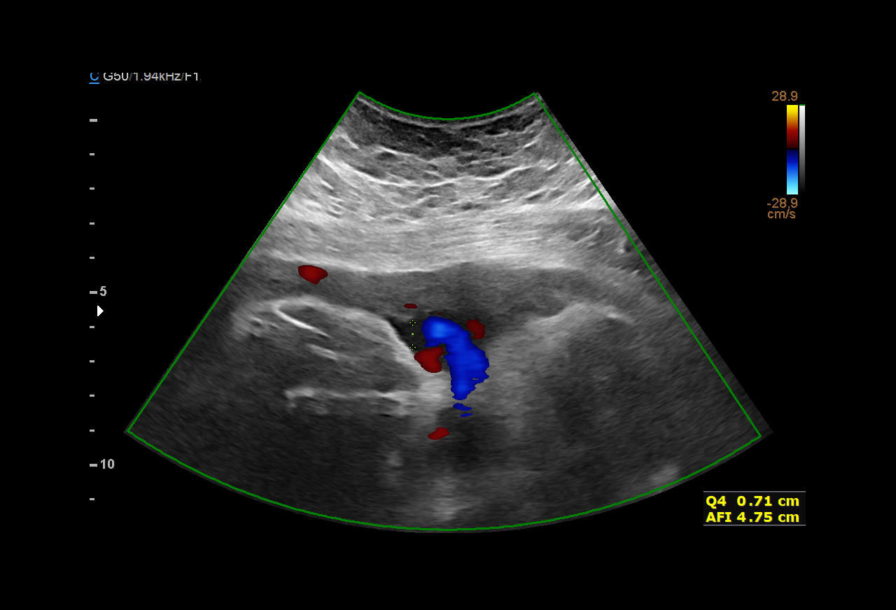
[im 13/23]
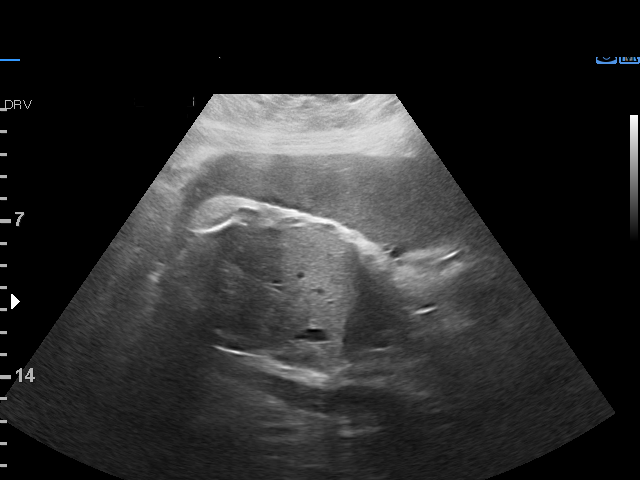
[im 15/23]
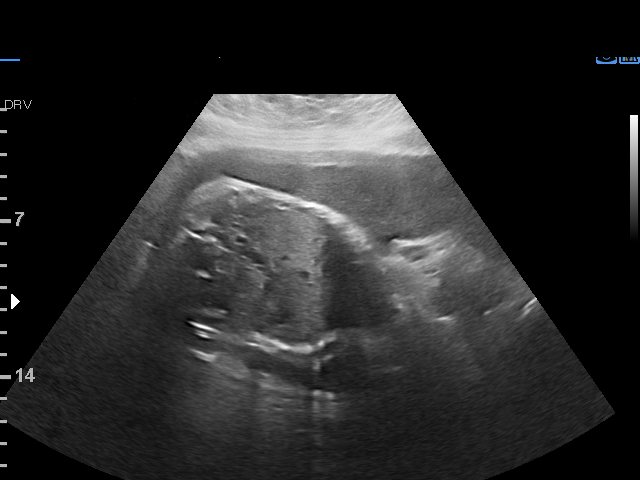
[im 17/23]
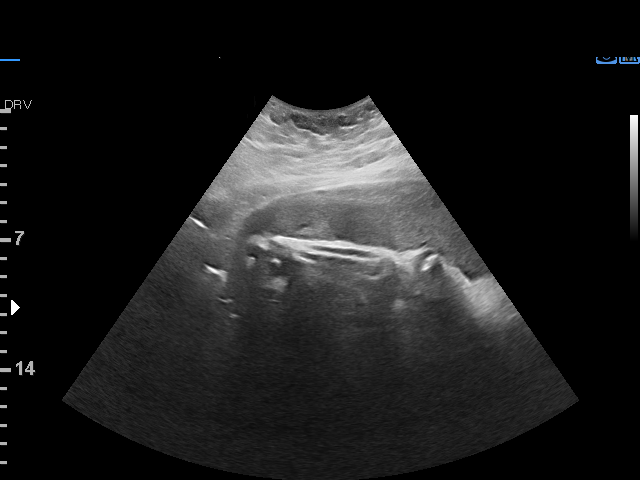
[im 19/23]
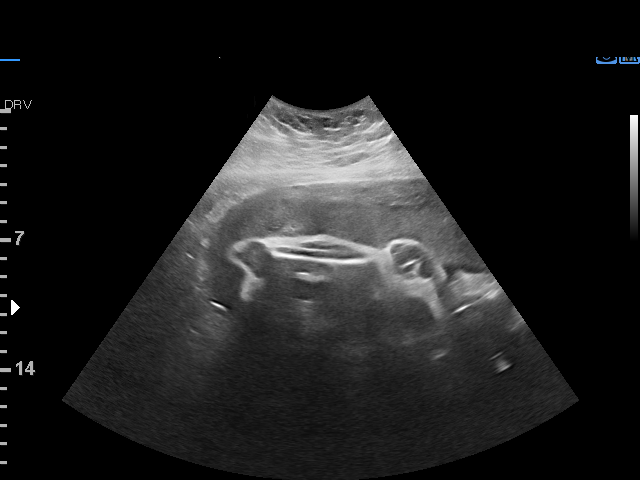
[im 21/23]
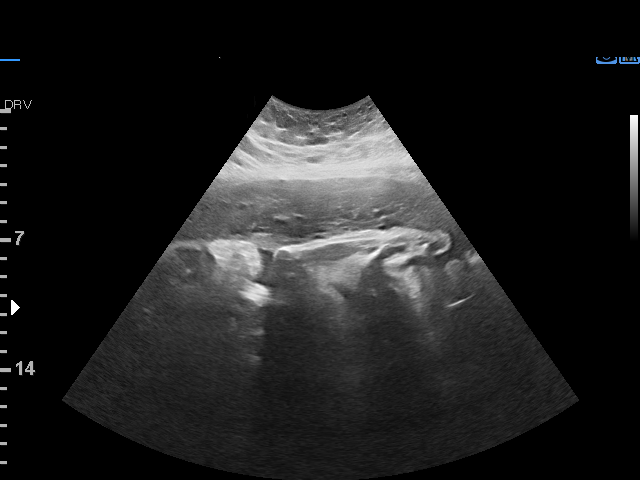
[im 23/23]
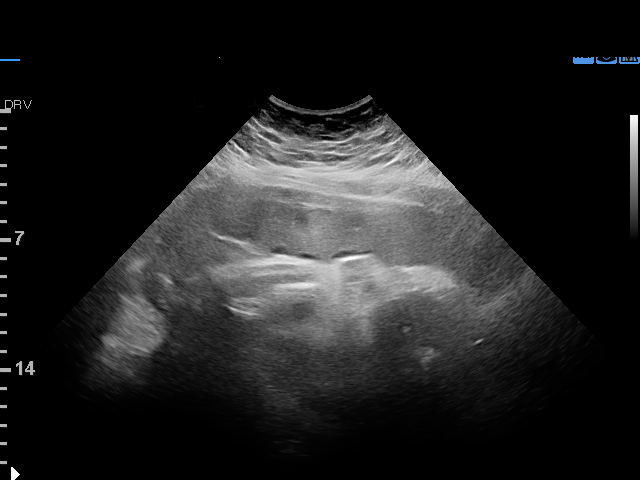

[12 of 23 positions shown; findings below may reference images not displayed]

EVANGELINE NP

 ----------------------------------------------------------------------

 ----------------------------------------------------------------------
Indications

  Non-reactive NST
  34 weeks gestation of pregnancy
  Maternal morbid obesity
  Gestational hypertension without significant
  proteinuria, third trimester (labetalol)
  Oligohydraminios, third trimester,
  unspecified (seen on today's ultrasound)
 ----------------------------------------------------------------------
Fetal Evaluation

 Num Of Fetuses:         1
 Fetal Heart Rate(bpm):  148
 Cardiac Activity:       Observed
 Presentation:           Cephalic
 Placenta:               Anterior

 Amniotic Fluid
 AFI FV:      Oligohydramnios

 AFI Sum(cm)     %Tile       Largest Pocket(cm)
 4.75            < 3

 RUQ(cm)       RLQ(cm)       LUQ(cm)        LLQ(cm)

Biophysical Evaluation

 Amniotic F.V:   Pocket => 2 cm             F. Tone:        Observed
 F. Movement:    Observed                   Score:          [DATE]
 F. Breathing:   Observed
OB History
 Gravidity:    1
Gestational Age

 LMP:           36w 0d        Date:  05/09/19                 EDD:   02/13/20
 Best:          34w 2d     Det. By:  Early Ultrasound         EDD:   02/25/20
                                     (07/10/19)
Cervix Uterus Adnexa

 Cervix
 Not visualized (advanced GA >33wks)
Impression

 Patient with gestational hypertension was evaluated at the
 [facility]. Ultrasound (BPP)  was requested because of
 nonreactive NST. Patient takes labetalol.
 Oligohydramnios was seen (AFI=5 cm). Antenatal testing is
 reassuring. BPP [DATE].
Recommendations

 Will send a message to her provider through [REDACTED] for close
 follow-up.
                 Murthy, Rufino

## 2022-01-26 ENCOUNTER — Other Ambulatory Visit: Payer: Self-pay | Admitting: Physician Assistant

## 2022-01-26 DIAGNOSIS — K810 Acute cholecystitis: Secondary | ICD-10-CM

## 2022-02-17 ENCOUNTER — Ambulatory Visit
Admission: RE | Admit: 2022-02-17 | Discharge: 2022-02-17 | Disposition: A | Discharge status: Home / Self Care | Payer: Medicaid Other | Source: Ambulatory Visit | Attending: Physician Assistant | Admitting: Physician Assistant

## 2022-02-17 DIAGNOSIS — K810 Acute cholecystitis: Secondary | ICD-10-CM

## 2022-07-12 ENCOUNTER — Telehealth: Payer: Self-pay

## 2022-07-12 NOTE — Telephone Encounter (Signed)
Spoke w/pt this afternoon, stated that she no longer see Dr. Dennard Schaumann as her PCP.

## 2022-11-17 ENCOUNTER — Other Ambulatory Visit: Payer: Self-pay | Admitting: Physician Assistant

## 2022-11-17 DIAGNOSIS — Z1231 Encounter for screening mammogram for malignant neoplasm of breast: Secondary | ICD-10-CM

## 2024-07-31 ENCOUNTER — Other Ambulatory Visit: Payer: Self-pay

## 2024-07-31 ENCOUNTER — Emergency Department (HOSPITAL_COMMUNITY)
Admission: EM | Admit: 2024-07-31 | Discharge: 2024-07-31 | Disposition: A | Discharge status: Home / Self Care | Payer: Self-pay | Attending: Emergency Medicine | Admitting: Emergency Medicine

## 2024-07-31 ENCOUNTER — Encounter (HOSPITAL_COMMUNITY): Payer: Self-pay

## 2024-07-31 DIAGNOSIS — J020 Streptococcal pharyngitis: Secondary | ICD-10-CM | POA: Insufficient documentation

## 2024-07-31 LAB — GROUP A STREP BY PCR: Group A Strep by PCR: DETECTED — AB

## 2024-07-31 MED ORDER — AMOXICILLIN-POT CLAVULANATE 875-125 MG PO TABS
1.0000 | ORAL_TABLET | Freq: Once | ORAL | Status: DC
  Filled 2024-07-31: qty 1

## 2024-07-31 MED ORDER — DEXAMETHASONE 4 MG PO TABS
10.0000 mg | ORAL_TABLET | Freq: Once | ORAL | Status: AC
  Administered 2024-07-31: 10 mg via ORAL
  Filled 2024-07-31: qty 3

## 2024-07-31 MED ORDER — AMOXICILLIN-POT CLAVULANATE 875-125 MG PO TABS
1.0000 | ORAL_TABLET | Freq: Two times a day (BID) | ORAL | 0 refills | Status: DC
Start: 2024-07-31 — End: 2024-07-31

## 2024-07-31 MED ORDER — LIDOCAINE VISCOUS HCL 2 % MT SOLN: 15.0000 mL | OROMUCOSAL | 0 refills | Status: AC | PRN

## 2024-07-31 MED ORDER — AMOXICILLIN 500 MG PO CAPS
500.0000 mg | ORAL_CAPSULE | Freq: Once | ORAL | Status: AC
  Administered 2024-07-31: 500 mg via ORAL
  Filled 2024-07-31: qty 1

## 2024-07-31 MED ORDER — LIDOCAINE VISCOUS HCL 2 % MT SOLN
15.0000 mL | Freq: Once | OROMUCOSAL | Status: AC
  Administered 2024-07-31: 15 mL via OROMUCOSAL
  Filled 2024-07-31: qty 15

## 2024-07-31 MED ORDER — AMOXICILLIN 500 MG PO CAPS: 500.0000 mg | ORAL_CAPSULE | Freq: Two times a day (BID) | ORAL | 0 refills | Status: AC

## 2024-07-31 NOTE — ED Triage Notes (Addendum)
 Pt c/o swelling in throat ongoing for past month. Report she has had this problem before but now it is affecting her ability to swallow.

## 2024-07-31 NOTE — ED Provider Notes (Signed)
 [place] EMERGENCY DEPARTMENT AT [facility] Provider Note   CSN: [id number] Arrival date & time: 07/31/24  1447     Patient presents with: No chief complaint on file.   Tammy Howard is a 33 y.o. female reportedly otherwise healthy presents the urgency department today for evaluation of sore throat, runny nose, nasal congestion, and post nasal drip off-and-on for the past few weeks. She reports that she has been seen at [facility] for this twice and told it was anxiety. Reports that her child recently was sick as well. She reports that she is having some pain with swallowing, but no difficulty swallowing. Still has been able to eat and drink and reports that sometimes it actually soothing with doing so. Denies any trouble breathing, drooling, or fevers. Denies any new rash, dark urine, cough, or abdominal pain. NKDA. Occasional MJ use.   HPI     Prior to Admission medications   Medication Sig Start Date End Date Taking? Authorizing Provider  amoxicillin  (AMOXIL ) 500 MG capsule Take 1 capsule (500 mg total) by mouth 2 (two) times daily for 10 days. 07/31/24 08/10/24 Yes Bernis Ernst, PA-C  lidocaine (XYLOCAINE) 2 % solution Use as directed 15 mLs in the mouth or throat every 4 (four) hours as needed for mouth pain. 07/31/24  Yes Bernis Ernst, PA-C  diclofenac  Sodium (VOLTAREN ) 1 % GEL Apply 2 g topically 4 (four) times daily. Patient not taking: No sig reported 03/09/20   Duanne Butler DASEN, MD  ferrous sulfate  325 (65 FE) MG tablet Take 1 tablet (325 mg total) by mouth 2 (two) times daily with a meal. Patient not taking: No sig reported 01/25/20   Linnell Devere FORBES, MD  fluticasone  (FLONASE ) 50 MCG/ACT nasal spray Place 2 sprays into both nostrils daily. 11/11/20   Chandra Harlene LABOR, NP  guaiFENesin  (MUCINEX ) 600 MG 12 hr tablet Take 1 tablet (600 mg total) by mouth 2 (two) times daily as needed for cough or to loosen phlegm. 11/11/20   Chandra Harlene LABOR, NP  ibuprofen   (ADVIL ) 600 MG tablet Take 1 tablet (600 mg total) by mouth every 6 (six) hours. 01/25/20   Linnell Devere FORBES, MD  labetalol  (NORMODYNE ) 200 MG tablet Take 1 tablet (200 mg total) by mouth 3 (three) times daily. Patient not taking: No sig reported 01/25/20   Linnell Devere FORBES, MD  levocetirizine (XYZAL ) 5 MG tablet TAKE 1 TABLET BY MOUTH EVERY DAY IN THE EVENING 12/30/20   Duanne Butler DASEN, MD  Norethindrone  Acetate-Ethinyl Estradiol (LOESTRIN 1.5/30, 21,) 1.5-30 MG-MCG tablet Take 1 tablet by mouth daily. Patient not taking: Reported on 10/01/2020 08/31/20   Duanne Butler DASEN, MD  pantoprazole  (PROTONIX ) 40 MG tablet Take 1 tablet (40 mg total) by mouth daily. Patient not taking: No sig reported 03/09/20   Duanne Butler DASEN, MD  predniSONE  (DELTASONE ) 20 MG tablet 3 tabs poqday 1-2, 2 tabs poqday 3-4, 1 tab poqday 5-6 11/19/20   Duanne Butler DASEN, MD  predniSONE  (DELTASONE ) 20 MG tablet 3 tabs poqday 1-2, 2 tabs poqday 3-4, 1 tab poqday 5-6 11/19/20   Duanne Butler DASEN, MD  Prenatal Vit-Fe Fumarate-FA (PRENATAL MULTIVITAMIN) TABS tablet Take 1 tablet by mouth daily at 12 noon. Patient not taking: No sig reported    [provider]  sertraline (ZOLOFT) 50 MG tablet Take 50 mg by mouth daily. Patient not taking: No sig reported 02/28/20   [provider]  sodium chloride  (OCEAN) 0.65 % SOLN nasal spray Place  1 spray into both nostrils as needed for congestion. 11/11/20   Chandra Harlene LABOR, NP    Allergies: Bee venom    Review of Systems  Constitutional:  Negative for chills and fever.  HENT:  Positive for congestion, rhinorrhea and sore throat. Negative for trouble swallowing.   Respiratory:  Negative for cough and shortness of breath.   Genitourinary:  Negative for dysuria.  Skin:  Negative for rash.  See HPI  Updated Vital Signs BP 138/79 (BP Location: Right Arm)   Pulse 74   Temp 97.8 F (36.6 C) (Oral)   Resp 16   Ht 5' 5 (1.651 m)   Wt 136.1 kg   LMP  (LMP Unknown)    SpO2 96%   Breastfeeding No   BMI 49.92 kg/m   Physical Exam Vitals and nursing note reviewed.  Constitutional:      General: She is not in acute distress.    Appearance: She is not ill-appearing or toxic-appearing.  HENT:     Right Ear: Tympanic membrane, ear canal and external ear normal.     Left Ear: Tympanic membrane, ear canal and external ear normal.     Nose: Congestion present.     Mouth/Throat:     Mouth: Mucous membranes are moist.     Comments: Uvula midline.  Airway patent.  Tonsils 1+ bilaterally.  Some slight pharyngeal erythema with posterior cobblestoning but no exudate or lesions appreciated.  No subungual elevation.  No trismus.  Controlling secretions.  Normal phonation. Eyes:     General: No scleral icterus. Neck:     Comments: Some cervical lymphadenopathy.  No supraclavicular lymphadenopathy. No overlying warmth or erythema.  Cardiovascular:     Rate and Rhythm: Normal rate.  Pulmonary:     Effort: Pulmonary effort is normal. No respiratory distress.     Breath sounds: No stridor.  Musculoskeletal:     Cervical back: No rigidity.  Lymphadenopathy:     Cervical: Cervical adenopathy present.  Skin:    General: Skin is warm and dry.  Neurological:     Mental Status: She is alert.     (all labs ordered are listed, but only abnormal results are displayed) Labs Reviewed  GROUP A STREP BY PCR - Abnormal; Notable for the following components:      Result Value   Group A Strep by PCR DETECTED (*)    All other components within normal limits    EKG: None  Radiology: No results found.  Procedures   Medications Ordered in the ED  lidocaine (XYLOCAINE) 2 % viscous mouth solution 15 mL (15 mLs Mouth/Throat Given 07/31/24 2138)  dexamethasone  (DECADRON ) tablet 10 mg (10 mg Oral Given 07/31/24 2137)  amoxicillin  (AMOXIL ) capsule 500 mg (500 mg Oral Given 07/31/24 2138)    Medical Decision Making Risk Prescription drug management.   33 y.o. female  presents to the ER for evaluation of sore throat. Differential diagnosis includes but is not limited to Viral pharyngitis, strep pharyngitis, dental caries/abscess, esophagitis, sinusitis, post nasal drip, reflux, angioedema, RTA/PTA, Ludwig's angina. Vital signs unremarkable. Physical exam as noted above.   I independently reviewed and interpreted the patient's labs. Strep positive.  The patient is positive for strep.  She is controlling secretions.  Normal phonation.  No stridor auscultated.  She is not complaining of difficulty swallowing, but is having pain with swallowing.  Reports it is more painful to swallow just her saliva but is less painful to swallow with food or drink.  She has not had any fever.  Reports she is having some runny nose, nasal gesturing, and some postnasal drip as well.  She denies any dark urine or any rashes.  She denies any drooling, shortness of breath, or difficulty breathing.  She reports that she has been seen at [facility] twice over the past few weeks for this but was told she had anxiety per patient.  On my examination, she has a midline uvula and her airway is patent.  Normal phonation and controlling secretions independently.  I do not appreciate any stridor.  She does have some cervical adenopathy.  I do not appreciate any sublingual elevation.  I do not see need for advanced imaging at this time and would like to try conservative treatment with antibiotics and viscous lidocaine.  Doubt Ludwig's or PTA or retropharyngeal abscess.  I given her a dose of Decadron  here to help with some of her enlarged tonsils.  She reports that she has been seen at ENT previously for this.  Unfortunately, Bicillin is on recall.  Will discharge patient home on amoxicillin  with some viscous lidocaine.  She is given her first dose here in the emergency department.  We discussed treatment and supportive care measures like not sharing drinks, changing out toothbrushes, etc.  I given  her information for an ENT as well as her primary care provider for her to follow-up with.  We discussed the results of the labs/imaging. The plan is supportive care, follow-up with specialty, follow-up PCP, strict return precautions. We discussed strict return precautions and red flag symptoms. The patient verbalized their understanding and agrees to the plan. The patient is stable and being discharged home in good condition.  Portions of this report may have been transcribed using voice recognition software. Every effort was made to ensure accuracy; however, inadvertent computerized transcription errors may be present.    Final diagnoses:  Strep throat    ED Discharge Orders          Ordered    amoxicillin -clavulanate (AUGMENTIN) 875-125 MG tablet  Every 12 hours,   Status:  Discontinued        07/31/24 2133    lidocaine (XYLOCAINE) 2 % solution  Every 4 hours PRN        07/31/24 2133    amoxicillin  (AMOXIL ) 500 MG capsule  2 times daily        07/31/24 2134               Bernis Ernst, PA-C 07/31/24 2251    Franklyn Sid SAILOR, MD 08/01/24 [phone]

## 2024-07-31 NOTE — Discharge Instructions (Addendum)
 You were seen in the ER today for evaluation of your symptoms. You tested positive for strep throat. You will need to use a new toothbrush and not share drinks for the next week. You will need to get another toothbrush again after this. Take the antibiotic twice a day for the next 10 days. For pain, I recommend taking 1000mg  of Tylenol  and/or 600mg  of ibuprofen  every 6 hours as needed for pain. Do not take ibuprofen  if you are concerned for pregnancy. Please follow up with ENT and a Primary Care Provider. I have included the information for them into the discharge paperwork for you to call to schedule an appointment. For the lidocaine, do not eat or drink any very hot or col food/drink as it can cause burns. If you have any concerns, new or worsening symptoms, please return to the ER for evaluation.   Contact a health care provider if: You have swelling in your neck that keeps getting bigger. You develop a rash, cough, or earache. You cough up a thick mucus that is green, yellow-brown, or bloody. You have pain or discomfort that does not get better with medicine. Your symptoms seem to be getting worse. You have a fever. Get help right away if: You have new symptoms, such as vomiting, severe headache, stiff or painful neck, chest pain, or shortness of breath. You have severe throat pain, drooling, or changes in your voice. You have swelling of the neck, or the skin on the neck becomes red and tender. You have signs of dehydration, such as tiredness (fatigue), dry mouth, and decreased urination. You become increasingly sleepy, or you cannot wake up completely. Your joints become red or painful. These symptoms may represent a serious problem that is an emergency. Do not wait to see if the symptoms will go away. Get medical help right away. Call your local emergency services (911 in the [place] Do not drive yourself to the hospital.

## 2024-08-02 ENCOUNTER — Telehealth (HOSPITAL_COMMUNITY): Payer: Self-pay

## 2024-08-02 NOTE — Telephone Encounter (Signed)
 Contacted pt. And verified her id.  Pt. Had questions concerning her discharge instructions and medications from her ED visit on 07/31/2024. All of her questions answered and she did pick up the correct antibiotic and the medication was started yesterday. 08/01/2024 She was very thankful for the call and all of her concerns and questions answered.

## 2024-08-05 ENCOUNTER — Emergency Department (HOSPITAL_COMMUNITY)
Admission: EM | Admit: 2024-08-05 | Discharge: 2024-08-05 | Disposition: A | Discharge status: Home / Self Care | Payer: Self-pay | Attending: Emergency Medicine | Admitting: Emergency Medicine

## 2024-08-05 ENCOUNTER — Other Ambulatory Visit: Payer: Self-pay

## 2024-08-05 ENCOUNTER — Emergency Department (HOSPITAL_COMMUNITY): Payer: Self-pay

## 2024-08-05 ENCOUNTER — Encounter (HOSPITAL_COMMUNITY): Payer: Self-pay

## 2024-08-05 DIAGNOSIS — E041 Nontoxic single thyroid nodule: Secondary | ICD-10-CM | POA: Insufficient documentation

## 2024-08-05 DIAGNOSIS — K219 Gastro-esophageal reflux disease without esophagitis: Secondary | ICD-10-CM | POA: Insufficient documentation

## 2024-08-05 DIAGNOSIS — R1310 Dysphagia, unspecified: Secondary | ICD-10-CM

## 2024-08-05 DIAGNOSIS — Z79899 Other long term (current) drug therapy: Secondary | ICD-10-CM | POA: Insufficient documentation

## 2024-08-05 LAB — CBC WITH DIFFERENTIAL/PLATELET
Abs Immature Granulocytes: 0.02 K/uL (ref 0.00–0.07)
Basophils Absolute: 0 K/uL (ref 0.0–0.1)
Basophils Relative: 1 %
Eosinophils Absolute: 0.1 K/uL (ref 0.0–0.5)
Eosinophils Relative: 2 %
HCT: 44.9 % (ref 36.0–46.0)
Hemoglobin: 14.8 g/dL (ref 12.0–15.0)
Immature Granulocytes: 0 %
Lymphocytes Relative: 27 %
Lymphs Abs: 1.6 K/uL (ref 0.7–4.0)
MCH: 29.1 pg (ref 26.0–34.0)
MCHC: 33 g/dL (ref 30.0–36.0)
MCV: 88.2 fL (ref 80.0–100.0)
Monocytes Absolute: 0.3 K/uL (ref 0.1–1.0)
Monocytes Relative: 5 %
Neutro Abs: 3.8 K/uL (ref 1.7–7.7)
Neutrophils Relative %: 65 %
Platelets: 364 K/uL (ref 150–400)
RBC: 5.09 MIL/uL (ref 3.87–5.11)
RDW: 12.7 % (ref 11.5–15.5)
WBC: 5.8 K/uL (ref 4.0–10.5)
nRBC: 0 % (ref 0.0–0.2)

## 2024-08-05 LAB — GROUP A STREP BY PCR: Group A Strep by PCR: NOT DETECTED

## 2024-08-05 LAB — I-STAT CHEM 8, ED
BUN: 4 mg/dL — ABNORMAL LOW (ref 6–20)
Calcium, Ion: 1.15 mmol/L (ref 1.15–1.40)
Chloride: 104 mmol/L (ref 98–111)
Creatinine, Ser: 0.7 mg/dL (ref 0.44–1.00)
Glucose, Bld: 101 mg/dL — ABNORMAL HIGH (ref 70–99)
HCT: 45 % (ref 36.0–46.0)
Hemoglobin: 15.3 g/dL — ABNORMAL HIGH (ref 12.0–15.0)
Potassium: 3.8 mmol/L (ref 3.5–5.1)
Sodium: 141 mmol/L (ref 135–145)
TCO2: 23 mmol/L (ref 22–32)

## 2024-08-05 LAB — TSH: TSH: 0.39 u[IU]/mL (ref 0.350–4.500)

## 2024-08-05 LAB — BASIC METABOLIC PANEL WITH GFR
Anion gap: 12 (ref 5–15)
BUN: 5 mg/dL — ABNORMAL LOW (ref 6–20)
CO2: 23 mmol/L (ref 22–32)
Calcium: 9.3 mg/dL (ref 8.9–10.3)
Chloride: 105 mmol/L (ref 98–111)
Creatinine, Ser: 0.68 mg/dL (ref 0.44–1.00)
GFR, Estimated: >60 mL/min (ref >60)
Glucose, Bld: 99 mg/dL (ref 70–99)
Potassium: 3.9 mmol/L (ref 3.5–5.1)
Sodium: 140 mmol/L (ref 135–145)

## 2024-08-05 LAB — T4, FREE: Free T4: 0.79 ng/dL (ref 0.61–1.12)

## 2024-08-05 MED ORDER — IOHEXOL 350 MG/ML SOLN
75.0000 mL | Freq: Once | INTRAVENOUS | Status: AC | PRN
  Administered 2024-08-05: 75 mL via INTRAVENOUS

## 2024-08-05 MED ORDER — OMEPRAZOLE 20 MG PO CPDR
20.0000 mg | DELAYED_RELEASE_CAPSULE | Freq: Every day | ORAL | 1 refills | Status: AC
Start: 2024-08-05 — End: ?

## 2024-08-05 MED ORDER — OMEPRAZOLE 20 MG PO CPDR: 20.0000 mg | DELAYED_RELEASE_CAPSULE | Freq: Every day | ORAL | 1 refills | Status: DC

## 2024-08-05 NOTE — ED Notes (Signed)
 Patient transported to CT

## 2024-08-05 NOTE — Discharge Instructions (Addendum)
 As discussed, follow-up with your primary care, community clinic referrals been provided, call to schedule appointments within the next 2 weeks.  You have also been given a course of omeprazole  for your reflux, continue to take these until otherwise advised by primary care physician.  You have been provided with a referral for ENT, strongly advised that you follow-up with them given your difficulty swallowing as well as the thyroid  nodules that were noted on the CT imaging.

## 2024-08-05 NOTE — ED Provider Notes (Signed)
  EMERGENCY DEPARTMENT AT [facility] Provider Note   CSN: [id number] Arrival date & time: 08/05/24  9085     Patient presents with: Sore Throat   Tammy Howard is a 33 y.o. female who presents to the ED out of concern for throat pain and a difficulty swallowing that has been going on over the last month.  Seen in this ED on 8 October and began treatment for streptococcal pharyngitis at that time.  She has been taking amoxicillin  as prescribed since that visit and has had some improvement in her throat pain however states that she still has difficulty swallowing, endorses intermittent odynophagia.  Further she is concerned that she has lost 30 pounds over the last month, attributing this to her dysphagia.  She states originally pain was more on the left side of posterior oropharynx and is now moved more to the right side and is concerned she may have ingested a fishbone as she has recently had fish in one of her meals and is concerned that she may have had an embedded fishbone in her oropharynx.      Sore Throat       Prior to Admission medications   Medication Sig Start Date End Date Taking? Authorizing Provider  amoxicillin  (AMOXIL ) 500 MG capsule Take 1 capsule (500 mg total) by mouth 2 (two) times daily for 10 days. 07/31/24 08/10/24  Bernis Ernst, PA-C  diclofenac  Sodium (VOLTAREN ) 1 % GEL Apply 2 g topically 4 (four) times daily. Patient not taking: No sig reported 03/09/20   Duanne Butler DASEN, MD  ferrous sulfate  325 (65 FE) MG tablet Take 1 tablet (325 mg total) by mouth 2 (two) times daily with a meal. Patient not taking: No sig reported 01/25/20   Linnell Devere FORBES, MD  fluticasone  (FLONASE ) 50 MCG/ACT nasal spray Place 2 sprays into both nostrils daily. 11/11/20   Chandra Harlene LABOR, NP  guaiFENesin  (MUCINEX ) 600 MG 12 hr tablet Take 1 tablet (600 mg total) by mouth 2 (two) times daily as needed for cough or to loosen phlegm. 11/11/20   Chandra Harlene LABOR, NP   ibuprofen  (ADVIL ) 600 MG tablet Take 1 tablet (600 mg total) by mouth every 6 (six) hours. 01/25/20   Linnell Devere FORBES, MD  labetalol  (NORMODYNE ) 200 MG tablet Take 1 tablet (200 mg total) by mouth 3 (three) times daily. Patient not taking: No sig reported 01/25/20   Linnell Devere FORBES, MD  levocetirizine (XYZAL ) 5 MG tablet TAKE 1 TABLET BY MOUTH EVERY DAY IN THE EVENING 12/30/20   Duanne Butler DASEN, MD  lidocaine (XYLOCAINE) 2 % solution Use as directed 15 mLs in the mouth or throat every 4 (four) hours as needed for mouth pain. 07/31/24   Bernis Ernst, PA-C  Norethindrone  Acetate-Ethinyl Estradiol (LOESTRIN 1.5/30, 21,) 1.5-30 MG-MCG tablet Take 1 tablet by mouth daily. Patient not taking: Reported on 10/01/2020 08/31/20   Duanne Butler DASEN, MD  omeprazole  (PRILOSEC) 20 MG capsule Take 1 capsule (20 mg total) by mouth daily. 08/05/24  Yes Myriam Dorn BROCKS, PA  pantoprazole  (PROTONIX ) 40 MG tablet Take 1 tablet (40 mg total) by mouth daily. Patient not taking: No sig reported 03/09/20   Duanne Butler DASEN, MD  predniSONE  (DELTASONE ) 20 MG tablet 3 tabs poqday 1-2, 2 tabs poqday 3-4, 1 tab poqday 5-6 11/19/20   Duanne Butler DASEN, MD  predniSONE  (DELTASONE ) 20 MG tablet 3 tabs poqday 1-2, 2 tabs poqday 3-4, 1 tab poqday 5-6 11/19/20  Duanne Butler DASEN, MD  Prenatal Vit-Fe Fumarate-FA (PRENATAL MULTIVITAMIN) TABS tablet Take 1 tablet by mouth daily at 12 noon. Patient not taking: No sig reported    [provider]  sertraline (ZOLOFT) 50 MG tablet Take 50 mg by mouth daily. Patient not taking: No sig reported 02/28/20   [provider]  sodium chloride  (OCEAN) 0.65 % SOLN nasal spray Place 1 spray into both nostrils as needed for congestion. 11/11/20   Chandra Harlene LABOR, NP    Allergies: Bee venom    Review of Systems  Updated Vital Signs BP (!) 124/92   Pulse 80   Resp 20   Ht 5' 5 (1.651 m)   Wt 129.3 kg   LMP 07/15/2024 (Approximate)   SpO2 97%   BMI 47.43 kg/m    Physical Exam  (all labs ordered are listed, but only abnormal results are displayed) Labs Reviewed  BASIC METABOLIC PANEL WITH GFR - Abnormal; Notable for the following components:      Result Value   BUN 5 (*)    All other components within normal limits  I-STAT CHEM 8, ED - Abnormal; Notable for the following components:   BUN 4 (*)    Glucose, Bld 101 (*)    Hemoglobin 15.3 (*)    All other components within normal limits  GROUP A STREP BY PCR  CBC WITH DIFFERENTIAL/PLATELET  TSH  T4, FREE    EKG: None  Radiology: CT Soft Tissue Neck W Contrast Result Date: 08/05/2024 EXAM: CT NECK WITH CONTRAST 08/05/2024 10:28:14 AM TECHNIQUE: CT of the neck was performed with the administration of 75 mL of iohexol (OMNIPAQUE) 350 MG/ML injection. Multiplanar reformatted images are provided for review. Automated exposure control, iterative reconstruction, and/or weight based adjustment of the mA/kV was utilized to reduce the radiation dose to as low as reasonably achievable. COMPARISON: None available. CLINICAL HISTORY: 33 year old female. Foreign body suspected, neck, neg xray. Throat pain, swelling, and 30lb weight loss over 1 month. FINDINGS: AERODIGESTIVE TRACT: Decompressed cervical esophagus, unremarkable. Decompressed visible upper thoracic esophagus also negative. Laryngeal and pharyngeal soft tissue contours are within normal limits. Homogeneous appearing tonsillar and adenoid enhancement is within normal limits. Negative parapharyngeal and retropharyngeal spaces. No radiopaque foreign body identified. SALIVARY GLANDS: The parotid and submandibular glands are unremarkable. THYROID : Subcentimeter right thyroid  nodules. Left thyroid  nodule up to 11 mm in the lower pole meets criteria for nonemergent follow up thyroid  ultrasound characterization. LYMPH NODES: No suspicious cervical lymphadenopathy. SOFT TISSUES: No mass or fluid collection. BRAIN, ORBITS, SINUSES AND MASTOIDS: No acute  abnormality. Visible paranasal sinuses, bilateral middle ears and mastoids are clear. LUNGS AND MEDIASTINUM: No acute abnormality. Negative visible upper chest. BONES: Intermittent cervical spine disc and endplate degeneration. No focal bone abnormality. IMPRESSION: 1. No acute or inflammatory process identified in the Neck. No retained foreign body identified. 2. Thyroid  nodules up to 11 mm; recommend nonemergent thyroid  ultrasound for characterization. Electronically signed by: Helayne Hurst MD 08/05/2024 10:48 AM EDT RP Workstation: [id number]     Procedures   Medications Ordered in the ED  iohexol (OMNIPAQUE) 350 MG/ML injection 75 mL (75 mLs Intravenous Contrast Given 08/05/24 1028)                                    Medical Decision Making Amount and/or Complexity of Data Reviewed Labs: ordered. Radiology: ordered.  Risk Prescription drug management.   Thyroid  nodules are  noted with some up to 11 mm, as such have checked TSH and T4 levels which are pending at the time of discharge.  Referral to ENT to be given for thyroid  follow-up as well as for dysphagia which the patient is been persistently having since prior to her streptococcal pharyngitis.  She also has concerns for symptoms that are consistent with esophageal reflux.  Will begin a course of omeprazole  to manage this.  This care plan was discussed with the patient, she understands and agrees has no further concerns at this time.  Plan at present is for discharge with outpatient follow-up to ENT, to follow-up labs with ENT as well as with primary care, she does not have current primary care so we will provide community clinic referral for same.     Final diagnoses:  Thyroid  nodule greater than or equal to 1.5 cm in diameter incidentally noted on imaging study  Dysphagia, unspecified type  Gastroesophageal reflux disease without esophagitis    ED Discharge Orders          Ordered    omeprazole  (PRILOSEC) 20 MG capsule   Daily        08/05/24 1237               Myriam Dorn BROCKS, PA 08/05/24 1237    Francesca Elsie CROME, MD 08/06/24 [phone]

## 2024-08-05 NOTE — ED Triage Notes (Signed)
 Pt c/o throat pain and swelling in throat for past month. Pt states she has lost 30lbs in past month d/t not being able to swallow. Pain was more on left and has now moved to right side.

## 2024-08-14 ENCOUNTER — Institutional Professional Consult (permissible substitution) (INDEPENDENT_AMBULATORY_CARE_PROVIDER_SITE_OTHER)

## 2024-08-28 ENCOUNTER — Institutional Professional Consult (permissible substitution) (INDEPENDENT_AMBULATORY_CARE_PROVIDER_SITE_OTHER)

## 2024-09-30 ENCOUNTER — Institutional Professional Consult (permissible substitution) (INDEPENDENT_AMBULATORY_CARE_PROVIDER_SITE_OTHER)
